# Patient Record
Sex: Female | Born: 1940 | Race: Black or African American | Hispanic: No | Smoking: Never smoker
Health system: Southern US, Community
[De-identification: ages and names within clinical notes are randomized; demographics above are authoritative.]

## PROBLEM LIST (undated history)

## (undated) DIAGNOSIS — K279 Peptic ulcer, site unspecified, unspecified as acute or chronic, without hemorrhage or perforation: Secondary | ICD-10-CM

## (undated) DIAGNOSIS — K219 Gastro-esophageal reflux disease without esophagitis: Secondary | ICD-10-CM

## (undated) DIAGNOSIS — Z5189 Encounter for other specified aftercare: Secondary | ICD-10-CM

## (undated) DIAGNOSIS — D649 Anemia, unspecified: Secondary | ICD-10-CM

## (undated) DIAGNOSIS — M1990 Unspecified osteoarthritis, unspecified site: Secondary | ICD-10-CM

## (undated) DIAGNOSIS — K208 Other esophagitis without bleeding: Secondary | ICD-10-CM

## (undated) HISTORY — DX: Encounter for other specified aftercare: Z51.89

## (undated) HISTORY — DX: Anemia, unspecified: D64.9

## (undated) HISTORY — DX: Unspecified osteoarthritis, unspecified site: M19.90

---

## 2019-11-30 ENCOUNTER — Inpatient Hospital Stay (HOSPITAL_COMMUNITY)
Admission: EM | Admit: 2019-11-30 | Discharge: 2019-12-04 | DRG: 812 | Disposition: A | Discharge status: Home / Self Care | Payer: Medicare HMO | Attending: Internal Medicine | Admitting: Internal Medicine

## 2019-11-30 ENCOUNTER — Other Ambulatory Visit: Payer: Self-pay

## 2019-11-30 DIAGNOSIS — B9681 Helicobacter pylori [H. pylori] as the cause of diseases classified elsewhere: Secondary | ICD-10-CM | POA: Diagnosis present

## 2019-11-30 DIAGNOSIS — K2970 Gastritis, unspecified, without bleeding: Secondary | ICD-10-CM | POA: Diagnosis present

## 2019-11-30 DIAGNOSIS — K642 Third degree hemorrhoids: Secondary | ICD-10-CM

## 2019-11-30 DIAGNOSIS — E876 Hypokalemia: Secondary | ICD-10-CM | POA: Diagnosis not present

## 2019-11-30 DIAGNOSIS — K5900 Constipation, unspecified: Secondary | ICD-10-CM | POA: Diagnosis present

## 2019-11-30 DIAGNOSIS — K219 Gastro-esophageal reflux disease without esophagitis: Secondary | ICD-10-CM | POA: Diagnosis present

## 2019-11-30 DIAGNOSIS — R531 Weakness: Secondary | ICD-10-CM

## 2019-11-30 DIAGNOSIS — D649 Anemia, unspecified: Secondary | ICD-10-CM | POA: Diagnosis not present

## 2019-11-30 DIAGNOSIS — Z20828 Contact with and (suspected) exposure to other viral communicable diseases: Secondary | ICD-10-CM | POA: Diagnosis present

## 2019-11-30 DIAGNOSIS — Z23 Encounter for immunization: Secondary | ICD-10-CM

## 2019-11-30 DIAGNOSIS — E8809 Other disorders of plasma-protein metabolism, not elsewhere classified: Secondary | ICD-10-CM | POA: Diagnosis present

## 2019-11-30 DIAGNOSIS — D509 Iron deficiency anemia, unspecified: Secondary | ICD-10-CM | POA: Diagnosis not present

## 2019-11-30 DIAGNOSIS — E875 Hyperkalemia: Secondary | ICD-10-CM | POA: Diagnosis present

## 2019-11-30 DIAGNOSIS — R5383 Other fatigue: Secondary | ICD-10-CM | POA: Diagnosis not present

## 2019-11-30 DIAGNOSIS — K635 Polyp of colon: Secondary | ICD-10-CM | POA: Diagnosis present

## 2019-11-30 DIAGNOSIS — K2980 Duodenitis without bleeding: Secondary | ICD-10-CM | POA: Diagnosis present

## 2019-11-30 HISTORY — DX: Gastro-esophageal reflux disease without esophagitis: K21.9

## 2019-11-30 LAB — CBC
HCT: 9.3 % — ABNORMAL LOW (ref 36.0–46.0)
Hemoglobin: 2.6 g/dL — CL (ref 12.0–15.0)
MCH: 18.2 pg — ABNORMAL LOW (ref 26.0–34.0)
MCHC: 28 g/dL — ABNORMAL LOW (ref 30.0–36.0)
MCV: 65 fL — ABNORMAL LOW (ref 80.0–100.0)
Platelets: 381 10*3/uL (ref 150–400)
RBC: 1.43 MIL/uL — ABNORMAL LOW (ref 3.87–5.11)
RDW: 19.5 % — ABNORMAL HIGH (ref 11.5–15.5)
WBC: 12.7 10*3/uL — ABNORMAL HIGH (ref 4.0–10.5)
nRBC: 1.4 % — ABNORMAL HIGH (ref 0.0–0.2)

## 2019-11-30 LAB — BASIC METABOLIC PANEL
Anion gap: 11 (ref 5–15)
BUN: 19 mg/dL (ref 8–23)
CO2: 22 mmol/L (ref 22–32)
Calcium: 8 mg/dL — ABNORMAL LOW (ref 8.9–10.3)
Chloride: 103 mmol/L (ref 98–111)
Creatinine, Ser: 0.68 mg/dL (ref 0.44–1.00)
GFR calc Af Amer: >60 mL/min (ref >60)
GFR calc non Af Amer: >60 mL/min (ref >60)
Glucose, Bld: 107 mg/dL — ABNORMAL HIGH (ref 70–99)
Potassium: 2.5 mmol/L — CL (ref 3.5–5.1)
Sodium: 136 mmol/L (ref 135–145)

## 2019-11-30 LAB — COMPREHENSIVE METABOLIC PANEL
ALT: 11 U/L (ref 0–44)
AST: 10 U/L — ABNORMAL LOW (ref 15–41)
Albumin: 3.2 g/dL — ABNORMAL LOW (ref 3.5–5.0)
Alkaline Phosphatase: 60 U/L (ref 38–126)
Anion gap: 13 (ref 5–15)
BUN: 21 mg/dL (ref 8–23)
CO2: 22 mmol/L (ref 22–32)
Calcium: 8.5 mg/dL — ABNORMAL LOW (ref 8.9–10.3)
Chloride: 99 mmol/L (ref 98–111)
Creatinine, Ser: 0.89 mg/dL (ref 0.44–1.00)
GFR calc Af Amer: >60 mL/min (ref >60)
GFR calc non Af Amer: >60 mL/min (ref >60)
Glucose, Bld: 118 mg/dL — ABNORMAL HIGH (ref 70–99)
Potassium: 2.3 mmol/L — CL (ref 3.5–5.1)
Sodium: 134 mmol/L — ABNORMAL LOW (ref 135–145)
Total Bilirubin: 0.4 mg/dL (ref 0.3–1.2)
Total Protein: 6.7 g/dL (ref 6.5–8.1)

## 2019-11-30 LAB — CBC WITH DIFFERENTIAL/PLATELET
Abs Immature Granulocytes: 0.15 10*3/uL — ABNORMAL HIGH (ref 0.00–0.07)
Basophils Absolute: 0 10*3/uL (ref 0.0–0.1)
Basophils Relative: 0 %
Eosinophils Absolute: 0 10*3/uL (ref 0.0–0.5)
Eosinophils Relative: 0 %
HCT: 9.1 % — ABNORMAL LOW (ref 36.0–46.0)
Hemoglobin: 2.5 g/dL — CL (ref 12.0–15.0)
Immature Granulocytes: 1 %
Lymphocytes Relative: 3 %
Lymphs Abs: 0.4 10*3/uL — ABNORMAL LOW (ref 0.7–4.0)
MCH: 17.5 pg — ABNORMAL LOW (ref 26.0–34.0)
MCHC: 27.5 g/dL — ABNORMAL LOW (ref 30.0–36.0)
MCV: 63.6 fL — ABNORMAL LOW (ref 80.0–100.0)
Monocytes Absolute: 0.9 10*3/uL (ref 0.1–1.0)
Monocytes Relative: 6 %
Neutro Abs: 13.7 10*3/uL — ABNORMAL HIGH (ref 1.7–7.7)
Neutrophils Relative %: 90 %
Platelets: 432 10*3/uL — ABNORMAL HIGH (ref 150–400)
RBC: 1.43 MIL/uL — ABNORMAL LOW (ref 3.87–5.11)
RDW: 19.1 % — ABNORMAL HIGH (ref 11.5–15.5)
WBC: 15.2 10*3/uL — ABNORMAL HIGH (ref 4.0–10.5)
nRBC: 1.3 % — ABNORMAL HIGH (ref 0.0–0.2)

## 2019-11-30 LAB — RETICULOCYTES
Immature Retic Fract: 20.7 % — ABNORMAL HIGH (ref 2.3–15.9)
RBC.: 1.49 MIL/uL — ABNORMAL LOW (ref 3.87–5.11)
Retic Count, Absolute: 21.8 10*3/uL (ref 19.0–186.0)
Retic Ct Pct: 1.5 % (ref 0.4–3.1)

## 2019-11-30 LAB — IRON AND TIBC
Iron: 7 ug/dL — ABNORMAL LOW (ref 28–170)
Saturation Ratios: 2 % — ABNORMAL LOW (ref 10.4–31.8)
TIBC: 414 ug/dL (ref 250–450)
UIBC: 407 ug/dL

## 2019-11-30 LAB — POC OCCULT BLOOD, ED: Fecal Occult Bld: NEGATIVE

## 2019-11-30 LAB — PREPARE RBC (CROSSMATCH)

## 2019-11-30 LAB — FOLATE: Folate: 5.4 ng/mL — ABNORMAL LOW (ref >5.9)

## 2019-11-30 LAB — MRSA PCR SCREENING: MRSA by PCR: NEGATIVE

## 2019-11-30 LAB — MAGNESIUM: Magnesium: 2 mg/dL (ref 1.7–2.4)

## 2019-11-30 LAB — TSH: TSH: 0.836 u[IU]/mL (ref 0.350–4.500)

## 2019-11-30 LAB — FERRITIN: Ferritin: 4 ng/mL — ABNORMAL LOW (ref 11–307)

## 2019-11-30 LAB — PHOSPHORUS: Phosphorus: 2.2 mg/dL — ABNORMAL LOW (ref 2.5–4.6)

## 2019-11-30 LAB — VITAMIN B12: Vitamin B-12: 854 pg/mL (ref 180–914)

## 2019-11-30 MED ORDER — POTASSIUM CHLORIDE CRYS ER 20 MEQ PO TBCR
40.0000 meq | EXTENDED_RELEASE_TABLET | ORAL | Status: AC
  Administered 2019-12-01 (×2): 40 meq via ORAL
  Filled 2019-11-30 (×2): qty 2

## 2019-11-30 MED ORDER — CHLORHEXIDINE GLUCONATE CLOTH 2 % EX PADS
6.0000 | MEDICATED_PAD | Freq: Every day | CUTANEOUS | Status: DC
  Administered 2019-11-30 – 2019-12-04 (×5): 6 via TOPICAL

## 2019-11-30 MED ORDER — POTASSIUM CHLORIDE CRYS ER 20 MEQ PO TBCR
40.0000 meq | EXTENDED_RELEASE_TABLET | Freq: Once | ORAL | Status: AC
  Administered 2019-11-30: 40 meq via ORAL
  Filled 2019-11-30: qty 2

## 2019-11-30 MED ORDER — POTASSIUM CHLORIDE 10 MEQ/100ML IV SOLN
10.0000 meq | INTRAVENOUS | Status: AC
  Administered 2019-11-30 (×3): 10 meq via INTRAVENOUS
  Filled 2019-11-30 (×3): qty 100

## 2019-11-30 MED ORDER — FUROSEMIDE 10 MG/ML IJ SOLN
10.0000 mg | INTRAMUSCULAR | Status: DC
  Administered 2019-12-01 (×2): 10 mg via INTRAVENOUS
  Filled 2019-11-30 (×3): qty 2

## 2019-11-30 MED ORDER — PANTOPRAZOLE SODIUM 40 MG PO TBEC
40.0000 mg | DELAYED_RELEASE_TABLET | Freq: Every day | ORAL | Status: DC
  Administered 2019-11-30 – 2019-12-03 (×2): 40 mg via ORAL
  Filled 2019-11-30 (×3): qty 1

## 2019-11-30 MED ORDER — SODIUM CHLORIDE 0.9 % IV SOLN: 10.0000 mL/h | Freq: Once | INTRAVENOUS | Status: DC

## 2019-11-30 MED ORDER — SODIUM CHLORIDE 0.9 % IV BOLUS
500.0000 mL | Freq: Once | INTRAVENOUS | Status: AC
  Administered 2019-11-30: 500 mL via INTRAVENOUS

## 2019-11-30 NOTE — ED Notes (Signed)
Pt provided electronic consent for blood consent.

## 2019-11-30 NOTE — ED Provider Notes (Signed)
Emergency Department Provider Note   I have reviewed the triage vital signs and the nursing notes.   HISTORY  Chief Complaint Weakness   HPI Danielle Adkins is a 78 y.o. female with unknown PMH presents to the emergency department with mild fatigue over the past several weeks.  Patient lives at home with her son who is noticed some increased fatigue over the past several weeks.  He states that today he mistakenly thought she had some rectal bleeding but later realized that the patient just had some mild fecal incontinence.  Patient states she has not been having diarrhea but just had an accident.  There was no bright red blood or black in the bowel movements.  No vomiting.  No fevers or chills.  She denies any chest pain or shortness of breath.  No cough or sore throat.  Patient states that she does not see her primary care doctor regularly and has not taken any new medications.  She denies abdominal pain or back discomfort.  Patient tells me that her son made her come in to get checked out but that she feels completely fine.   No past medical history on file.  Patient Active Problem List   Diagnosis Date Noted  . Symptomatic anemia 11/30/2019   Allergies Patient has no known allergies.  No family history on file.  Social History Social History   Tobacco Use  . Smoking status: Not on file  Substance Use Topics  . Alcohol use: Not on file  . Drug use: Not on file    Review of Systems  Constitutional: No fever/chills. Positive mild fatigue.  Eyes: No visual changes. ENT: No sore throat. Cardiovascular: Denies chest pain. Respiratory: Denies shortness of breath. Gastrointestinal: No abdominal pain.  No nausea, no vomiting.  No diarrhea.  No constipation. Genitourinary: Negative for dysuria. Musculoskeletal: Negative for back pain. Skin: Negative for rash. Neurological: Negative for headaches, focal weakness or numbness.  10-point ROS otherwise negative.   ____________________________________________   PHYSICAL EXAM:  VITAL SIGNS: ED Triage Vitals  Enc Vitals Group     BP 11/30/19 1627 130/74     Pulse Rate 11/30/19 1627 87     Resp 11/30/19 1627 (!) 22     Temp 11/30/19 1627 98.2 F (36.8 C)     Temp Source 11/30/19 1627 Oral     SpO2 11/30/19 1623 96 %   Constitutional: Alert and oriented. Well appearing and in no acute distress. Eyes: Conjunctivae are normal.  Head: Atraumatic. Nose: No congestion/rhinnorhea. Mouth/Throat: Mucous membranes are moist.  Cardiovascular: Normal rate, regular rhythm. Good peripheral circulation. Grossly normal heart sounds.   Respiratory: Normal respiratory effort.  No retractions. Lungs CTAB. Gastrointestinal: Soft and nontender. No distention. Rectal exam performed with patient verbal consent and nurse chaperone. Brown stool without gross blood or melena. Hemoccult negative.  Musculoskeletal: No lower extremity tenderness nor edema. No gross deformities of extremities. Neurologic:  Normal speech and language. No gross focal neurologic deficits are appreciated.  Skin:  Skin is warm, dry and intact. No rash noted.  ____________________________________________   LABS (all labs ordered are listed, but only abnormal results are displayed)  Labs Reviewed  CBC WITH DIFFERENTIAL/PLATELET - Abnormal; Notable for the following components:      Result Value   WBC 15.2 (*)    RBC 1.43 (*)    Hemoglobin 2.5 (*)    HCT 9.1 (*)    MCV 63.6 (*)    MCH 17.5 (*)  MCHC 27.5 (*)    RDW 19.1 (*)    Platelets 432 (*)    nRBC 1.3 (*)    Neutro Abs 13.7 (*)    Lymphs Abs 0.4 (*)    Abs Immature Granulocytes 0.15 (*)    All other components within normal limits  COMPREHENSIVE METABOLIC PANEL - Abnormal; Notable for the following components:   Sodium 134 (*)    Potassium 2.3 (*)    Glucose, Bld 118 (*)    Calcium 8.5 (*)    Albumin 3.2 (*)    AST 10 (*)    All other components within normal limits   IRON AND TIBC - Abnormal; Notable for the following components:   Iron 7 (*)    Saturation Ratios 2 (*)    All other components within normal limits  FERRITIN - Abnormal; Notable for the following components:   Ferritin 4 (*)    All other components within normal limits  RETICULOCYTES - Abnormal; Notable for the following components:   RBC. 1.49 (*)    Immature Retic Fract 20.7 (*)    All other components within normal limits  CBC - Abnormal; Notable for the following components:   WBC 12.7 (*)    RBC 1.43 (*)    Hemoglobin 2.6 (*)    HCT 9.3 (*)    MCV 65.0 (*)    MCH 18.2 (*)    MCHC 28.0 (*)    RDW 19.5 (*)    nRBC 1.4 (*)    All other components within normal limits  MRSA PCR SCREENING  URINE CULTURE  SARS CORONAVIRUS 2 (TAT 6-24 HRS)  MAGNESIUM  VITAMIN B12  TSH  CBC WITH DIFFERENTIAL/PLATELET  URINALYSIS, ROUTINE W REFLEX MICROSCOPIC  FOLATE  BASIC METABOLIC PANEL  BASIC METABOLIC PANEL  PHOSPHORUS  HEMOGLOBIN  HEMOGLOBIN  HEMATOCRIT  HEMATOCRIT  BASIC METABOLIC PANEL  CBC  FOLATE RBC  POC OCCULT BLOOD, ED  TYPE AND SCREEN  PREPARE RBC (CROSSMATCH)  ABO/RH   ____________________________________________  EKG   EKG Interpretation  Date/Time:  Tuesday November 30 2019 16:49:17 EST Ventricular Rate:  77 PR Interval:    QRS Duration: 157 QT Interval:  418 QTC Calculation: 474 R Axis:   -3 Text Interpretation: Sinus rhythm Atrial premature complexes Right bundle branch block No STEMI. Similar to prior. Confirmed by Nanda Quinton [phone]) on 11/30/2019 4:51:24 PM       ____________________________________________  RADIOLOGY  None  ____________________________________________   PROCEDURES  Procedure(s) performed:   Procedures  CRITICAL CARE Performed by: Margette Fast Total critical care time: 35 minutes Critical care time was exclusive of separately billable procedures and treating other patients. Critical care was necessary to treat or  prevent imminent or life-threatening deterioration. Critical care was time spent personally by me on the following activities: development of treatment plan with patient and/or surrogate as well as nursing, discussions with consultants, evaluation of patient's response to treatment, examination of patient, obtaining history from patient or surrogate, ordering and performing treatments and interventions, ordering and review of laboratory studies, ordering and review of radiographic studies, pulse oximetry and re-evaluation of patient's condition.  Nanda Quinton, MD Emergency Medicine  ____________________________________________   INITIAL IMPRESSION / ASSESSMENT AND PLAN / ED COURSE  Pertinent labs & imaging results that were available during my care of the patient were reviewed by me and considered in my medical decision making (see chart for details).   Patient presents to the emergency department with mild generalized weakness and somewhat  decreased appetite.  Patient's neurologic exam, abdominal exam, cardiovascular exam are completely within normal limits.  Patient does agree to some screening blood work given lack of known medical history along with UA, IV fluids, EKG. I do not see indication for imaging at this time.  Patient's vital signs are unremarkable.  The initial report of possible rectal bleeding is apparently a mistake from the son who can now confirm that there was no rectal bleeding and only some mild fecal incontinence.  Patient with significant anemia with hemoglobin of 2.5.  This lab sample was a direct stick and not pulled from an IV.  Potassium also at 10.3.  I have added on a magnesium I discussed the results with the patient.  She is Hemoccult negative.  I have sent an anemia panel.  The son does pull me out of the room and states that his mom does drink regularly and is wondering if that could be contributing.  Patient consented to receive 3 units PRBC and reassess.    Discussed patient's case with [facility] to request admission. Patient and family (if present) updated with plan. Care transferred to [facility] service.  I reviewed all nursing notes, vitals, pertinent old records, EKGs, labs, imaging (as available).  ____________________________________________  FINAL CLINICAL IMPRESSION(S) / ED DIAGNOSES  Final diagnoses:  Symptomatic anemia  Generalized weakness  Hypokalemia     MEDICATIONS GIVEN DURING THIS VISIT:  Medications  0.9 %  sodium chloride infusion (has no administration in time range)  pantoprazole (PROTONIX) EC tablet 40 mg (40 mg Oral Given 11/30/19 2224)  furosemide (LASIX) injection 10 mg (has no administration in time range)  potassium chloride SA (KLOR-CON) CR tablet 40 mEq (has no administration in time range)  Chlorhexidine Gluconate Cloth 2 % PADS 6 each (6 each Topical Given 11/30/19 2117)  sodium chloride 0.9 % bolus 500 mL (0 mLs Intravenous Stopped 11/30/19 1839)  potassium chloride 10 mEq in 100 mL IVPB (0 mEq Intravenous Stopped 11/30/19 2311)  potassium chloride SA (KLOR-CON) CR tablet 40 mEq (40 mEq Oral Given 11/30/19 2032)    Note:  This document was prepared using Dragon voice recognition software and may include unintentional dictation errors.  Nanda Quinton, MD, [facility] Emergency Medicine    Shanel Prazak, Wonda Olds, MD 11/30/19 [phone]

## 2019-11-30 NOTE — H&P (Signed)
History and Physical  Nahla Bargman [id number] DOB: [birth year] DOA: 11/30/2019  Referring physician: ER provider PCP: Patient, No Pcp Per  Outpatient Specialists:    Patient coming from: Home  Chief Complaint: Weakness  HPI:  Patient is an 78 year old African-American female who has not been to the doctors in the long time.  Patient has no documented past medical history.  Patient is a very poor historian.  Apparently, patient's son was concerned as patient was becoming progressively weak.  Patient's son convinced the patient to come to the hospital.  On presentation to the hospital, patient was found to have hemoglobin of 2.5, potassium of 2.3, magnesium of 2 with MCV of 63.6.  Patient was on ibuprofen 200 Mg p.o. every 6 hourly as needed prior to presentation.  Patient denied hematochezia..  Occult blood was negative.  No headache, no neck pain, no chest pain, no shortness of breath, no fever or chills, no GI symptoms and no urinary symptoms.  Patient be admitted for further assessment and management.  ED Course: On presentation to the hospital, temperature was 98.4, blood pressure of 133/63, heart rate of 71, respiratory rate of 19 and O2 sat of 100%.  Pertinent labs revealed WBC of 12.7, hemoglobin of 2.5, potassium of 2.3.  Magnesium was 2.  First fecal occult blood came back negative.  Emergency room provider has crossmatched 3 units of packed red blood cells for transfusion.  Electrolytes are being repleted.    Pertinent labs: As documented above.  EKG: Independently reviewed.  Atrial premature complexes and right bundle branch block.  Review of Systems:  Negative for fever, visual changes, sore throat, rash, new muscle aches, chest pain, SOB, dysuria, bleeding, n/v/abdominal pain.  No past medical history on file.   has no history on file for tobacco, alcohol, and drug.  No Known Allergies  No family history on file.   Prior to Admission medications   Medication Sig Start  Date End Date Taking? Authorizing Provider  ibuprofen (ADVIL) 200 MG tablet Take 200 mg by mouth every 6 (six) hours as needed for fever or moderate pain.   Yes [provider]  omeprazole (PRILOSEC OTC) 20 MG tablet Take 20 mg by mouth daily.   Yes [provider]    Physical Exam: Vitals:   11/30/19 1627 11/30/19 1730 11/30/19 1800 11/30/19 1830  BP: 130/74 (!) 115/51 (!) 113/43 (!) 122/59  Pulse: 87   85  Resp: (!) 22 12 14 15   Temp: 98.2 F (36.8 C)     TempSrc: Oral     SpO2: 96%   95%    Constitutional:  . Appears calm and comfortable.  Thin. Eyes:  . Severe pallor. No jaundice.  ENMT:  . external ears, nose appear normal Neck:  . Neck is supple. No JVD Respiratory:  . CTA bilaterally, no w/r/r.  . Respiratory effort normal. No retractions or accessory muscle use Cardiovascular:  . S1S2 . No LE extremity edema   Abdomen:  . Abdomen is obese, soft and non tender. Organs are difficult to assess. Neurologic:  . Awake and alert. . Moves all limbs.  Wt Readings from Last 3 Encounters:  No data found for Wt    I have personally reviewed following labs and imaging studies  Labs on Admission:  CBC: Recent Labs  Lab 11/30/19 1740  WBC 15.2*  NEUTROABS 13.7*  HGB 2.5*  HCT 9.1*  MCV 63.6*  PLT 123456*   Basic Metabolic Panel: Recent Labs  Lab 11/30/19  1740  NA 134*  K 2.3*  CL 99  CO2 22  GLUCOSE 118*  BUN 21  CREATININE 0.89  CALCIUM 8.5*  MG 2.0   Liver Function Tests: Recent Labs  Lab 11/30/19 1740  AST 10*  ALT 11  ALKPHOS 60  BILITOT 0.4  PROT 6.7  ALBUMIN 3.2*   No results for input(s): LIPASE, AMYLASE in the last 168 hours. No results for input(s): AMMONIA in the last 168 hours. Coagulation Profile: No results for input(s): INR, PROTIME in the last 168 hours. Cardiac Enzymes: No results for input(s): CKTOTAL, CKMB, CKMBINDEX, TROPONINI in the last 168 hours. BNP (last 3 results) No results for input(s): PROBNP  in the last 8760 hours. HbA1C: No results for input(s): HGBA1C in the last 72 hours. CBG: No results for input(s): GLUCAP in the last 168 hours. Lipid Profile: No results for input(s): CHOL, HDL, LDLCALC, TRIG, CHOLHDL, LDLDIRECT in the last 72 hours. Thyroid Function Tests: No results for input(s): TSH, T4TOTAL, FREET4, T3FREE, THYROIDAB in the last 72 hours. Anemia Panel: No results for input(s): VITAMINB12, FOLATE, FERRITIN, TIBC, IRON, RETICCTPCT in the last 72 hours. Urine analysis: No results found for: COLORURINE, APPEARANCEUR, LABSPEC, PHURINE, GLUCOSEU, HGBUR, BILIRUBINUR, KETONESUR, PROTEINUR, UROBILINOGEN, NITRITE, LEUKOCYTESUR Sepsis Labs: @LABRCNTIP (procalcitonin:4,lacticidven:4) )No results found for this or any previous visit (from the past 240 hour(s)).    Radiological Exams on Admission: No results found.  EKG: Independently reviewed.   Active Problems:   Symptomatic anemia   Assessment/Plan Severe symptomatic anemia: -Place patient in observation. -Hemoglobin was 2.5 g/dL -Iron studies. -Fecal occult blood x3. -Suspect that the patient has GI blood loss -Check RBC folate and vitamin B12 -Consider consulting GI team in the morning.  Hypokalemia: -Potassium was 2.3. -Etiology unclear. -Continue to replete. -Continue to assess.  DVT prophylaxis: SCD. Code Status: Full code Family Communication:  Disposition Plan: Home eventually Consults called: Please consider consulting GI in the morning Admission status: Observation  Time spent: 60 minutes minutes  Dana Allan, MD  [facility] Pager #: [phone] 7PM-7AM contact night coverage as above  11/30/2019, 7:33 PM

## 2019-11-30 NOTE — ED Triage Notes (Signed)
Per EMS, Pt is coming from home. Pts son called because he believed pt had rectal bleeding. Pt did not have any rectal bleeding, but had a BM. Pt denies N/V/D. Pts only complaint is slight weakness for the past week.

## 2019-11-30 NOTE — ED Notes (Addendum)
Potassium 2.3, MD notified verbally.

## 2019-12-01 ENCOUNTER — Encounter (HOSPITAL_COMMUNITY): Payer: Self-pay

## 2019-12-01 ENCOUNTER — Other Ambulatory Visit: Payer: Self-pay

## 2019-12-01 DIAGNOSIS — B9681 Helicobacter pylori [H. pylori] as the cause of diseases classified elsewhere: Secondary | ICD-10-CM | POA: Diagnosis present

## 2019-12-01 DIAGNOSIS — E876 Hypokalemia: Secondary | ICD-10-CM | POA: Diagnosis present

## 2019-12-01 DIAGNOSIS — D649 Anemia, unspecified: Secondary | ICD-10-CM | POA: Diagnosis not present

## 2019-12-01 DIAGNOSIS — K642 Third degree hemorrhoids: Secondary | ICD-10-CM | POA: Diagnosis present

## 2019-12-01 DIAGNOSIS — K219 Gastro-esophageal reflux disease without esophagitis: Secondary | ICD-10-CM | POA: Diagnosis present

## 2019-12-01 DIAGNOSIS — K269 Duodenal ulcer, unspecified as acute or chronic, without hemorrhage or perforation: Secondary | ICD-10-CM | POA: Diagnosis not present

## 2019-12-01 DIAGNOSIS — Z23 Encounter for immunization: Secondary | ICD-10-CM | POA: Diagnosis present

## 2019-12-01 DIAGNOSIS — K2980 Duodenitis without bleeding: Secondary | ICD-10-CM | POA: Diagnosis present

## 2019-12-01 DIAGNOSIS — D500 Iron deficiency anemia secondary to blood loss (chronic): Secondary | ICD-10-CM | POA: Diagnosis not present

## 2019-12-01 DIAGNOSIS — K2210 Ulcer of esophagus without bleeding: Secondary | ICD-10-CM | POA: Diagnosis not present

## 2019-12-01 DIAGNOSIS — Z20828 Contact with and (suspected) exposure to other viral communicable diseases: Secondary | ICD-10-CM | POA: Diagnosis present

## 2019-12-01 DIAGNOSIS — K2970 Gastritis, unspecified, without bleeding: Secondary | ICD-10-CM | POA: Diagnosis present

## 2019-12-01 DIAGNOSIS — D509 Iron deficiency anemia, unspecified: Secondary | ICD-10-CM

## 2019-12-01 DIAGNOSIS — E8809 Other disorders of plasma-protein metabolism, not elsewhere classified: Secondary | ICD-10-CM | POA: Diagnosis present

## 2019-12-01 DIAGNOSIS — E875 Hyperkalemia: Secondary | ICD-10-CM | POA: Diagnosis present

## 2019-12-01 DIAGNOSIS — K5900 Constipation, unspecified: Secondary | ICD-10-CM | POA: Diagnosis present

## 2019-12-01 DIAGNOSIS — K635 Polyp of colon: Secondary | ICD-10-CM | POA: Diagnosis present

## 2019-12-01 DIAGNOSIS — R5383 Other fatigue: Secondary | ICD-10-CM | POA: Diagnosis present

## 2019-12-01 LAB — HEMATOCRIT: HCT: 21.4 % — ABNORMAL LOW (ref 36.0–46.0)

## 2019-12-01 LAB — BASIC METABOLIC PANEL
Anion gap: 11 (ref 5–15)
Anion gap: 10 (ref 5–15)
BUN: 17 mg/dL (ref 8–23)
BUN: 15 mg/dL (ref 8–23)
CO2: 21 mmol/L — ABNORMAL LOW (ref 22–32)
CO2: 21 mmol/L — ABNORMAL LOW (ref 22–32)
Calcium: 8.1 mg/dL — ABNORMAL LOW (ref 8.9–10.3)
Calcium: 8.4 mg/dL — ABNORMAL LOW (ref 8.9–10.3)
Chloride: 104 mmol/L (ref 98–111)
Chloride: 106 mmol/L (ref 98–111)
Creatinine, Ser: 0.65 mg/dL (ref 0.44–1.00)
Creatinine, Ser: 0.54 mg/dL (ref 0.44–1.00)
GFR calc Af Amer: >60 mL/min (ref >60)
GFR calc Af Amer: >60 mL/min (ref >60)
GFR calc non Af Amer: >60 mL/min (ref >60)
GFR calc non Af Amer: >60 mL/min (ref >60)
Glucose, Bld: 97 mg/dL (ref 70–99)
Glucose, Bld: 95 mg/dL (ref 70–99)
Potassium: 3.2 mmol/L — ABNORMAL LOW (ref 3.5–5.1)
Potassium: 4.1 mmol/L (ref 3.5–5.1)
Sodium: 136 mmol/L (ref 135–145)
Sodium: 137 mmol/L (ref 135–145)

## 2019-12-01 LAB — URINALYSIS, ROUTINE W REFLEX MICROSCOPIC
Bilirubin Urine: NEGATIVE
Glucose, UA: NEGATIVE mg/dL
Ketones, ur: NEGATIVE mg/dL
Nitrite: NEGATIVE
Protein, ur: NEGATIVE mg/dL
Specific Gravity, Urine: 1.008 (ref 1.005–1.030)
pH: 5 (ref 5.0–8.0)

## 2019-12-01 LAB — CBC
HCT: 21.1 % — ABNORMAL LOW (ref 36.0–46.0)
Hemoglobin: 6.8 g/dL — CL (ref 12.0–15.0)
MCH: 24.7 pg — ABNORMAL LOW (ref 26.0–34.0)
MCHC: 32.2 g/dL (ref 30.0–36.0)
MCV: 76.7 fL — ABNORMAL LOW (ref 80.0–100.0)
Platelets: 338 10*3/uL (ref 150–400)
RBC: 2.75 MIL/uL — ABNORMAL LOW (ref 3.87–5.11)
RDW: 22.5 % — ABNORMAL HIGH (ref 11.5–15.5)
WBC: 13.3 10*3/uL — ABNORMAL HIGH (ref 4.0–10.5)
nRBC: 2.6 % — ABNORMAL HIGH (ref 0.0–0.2)

## 2019-12-01 LAB — HEMOGLOBIN: Hemoglobin: 6.9 g/dL — CL (ref 12.0–15.0)

## 2019-12-01 LAB — SARS CORONAVIRUS 2 (TAT 6-24 HRS): SARS Coronavirus 2: NEGATIVE

## 2019-12-01 LAB — ABO/RH: ABO/RH(D): O POS

## 2019-12-01 LAB — PREPARE RBC (CROSSMATCH)

## 2019-12-01 MED ORDER — PEG-KCL-NACL-NASULF-NA ASC-C 100 G PO SOLR
0.5000 | Freq: Once | ORAL | Status: AC
  Administered 2019-12-01: 100 g via ORAL
  Filled 2019-12-01: qty 1

## 2019-12-01 MED ORDER — POTASSIUM CHLORIDE 10 MEQ/100ML IV SOLN
10.0000 meq | INTRAVENOUS | Status: AC
  Administered 2019-12-01 (×4): 10 meq via INTRAVENOUS
  Filled 2019-12-01 (×4): qty 100

## 2019-12-01 MED ORDER — SODIUM CHLORIDE 0.9% IV SOLUTION
Freq: Once | INTRAVENOUS | Status: AC
  Administered 2019-12-01: 12:00:00 via INTRAVENOUS

## 2019-12-01 MED ORDER — PEG-KCL-NACL-NASULF-NA ASC-C 100 G PO SOLR: 1.0000 | Freq: Once | ORAL | Status: DC

## 2019-12-01 MED ORDER — SODIUM CHLORIDE 0.9 % IV SOLN
510.0000 mg | Freq: Once | INTRAVENOUS | Status: AC
  Administered 2019-12-01: 510 mg via INTRAVENOUS
  Filled 2019-12-01: qty 17

## 2019-12-01 MED ORDER — FOLIC ACID 1 MG PO TABS
1.0000 mg | ORAL_TABLET | Freq: Every day | ORAL | Status: DC
  Administered 2019-12-03 – 2019-12-04 (×2): 1 mg via ORAL
  Filled 2019-12-01 (×3): qty 1

## 2019-12-01 MED ORDER — PEG-KCL-NACL-NASULF-NA ASC-C 100 G PO SOLR
0.5000 | Freq: Once | ORAL | Status: AC
  Administered 2019-12-02: 100 g via ORAL

## 2019-12-01 MED ORDER — BISACODYL 5 MG PO TBEC
10.0000 mg | DELAYED_RELEASE_TABLET | Freq: Once | ORAL | Status: AC
  Administered 2019-12-01: 10 mg via ORAL
  Filled 2019-12-01: qty 2

## 2019-12-01 NOTE — H&P (View-Only) (Signed)
Referring Provider:  [facility]         Primary Care Physician:  Patient, No Pcp Per Primary Gastroenterologist:  Unassigned           Reason for Consultation:   anemia                ASSESSMENT /  PLAN   1. 78 yo female with profound iron deficiency anemia, heme negative stools, no focal GI symptoms.  -For further evaluation will scheduled for EGD and colonoscopy. The risks and benefits of EGD and colonoscopy with possible polypectomy were discussed and the patient agrees to proceed.    2. Hyperkalemia, resolved.   3. Etoh use per her Son. Liver tests okay except mild hypoalbuminemia which may be nutritional.    HPI:    Danielle Adkins is a 78 y.o. female brought to ED by EMA yesterday. Son thought patient was having rectal bleeding but turned out to be just fecal incontinence. Patient profoundly anemic with hgb of 2.5, ferritin of 4.  Occasionally has seen some dark but not black stools and this hasn't happened recently. She denies any overt GI bleeding. No vaginal bleeding nor hematuria. No abdominal pain, no nausea / vomiting. Reports good appetite. No history of any GI problems other than occasional constipation and occasional GERD treated with Omeprazole or tums. She takes Ibuprofen as needed. May take several doses a day but very few days out of the month will she do this.    Past Medical History:  Diagnosis Date  . GERD (gastroesophageal reflux disease)     History reviewed. No pertinent surgical history.  Prior to Admission medications   Medication Sig Start Date End Date Taking? Authorizing Provider  ibuprofen (ADVIL) 200 MG tablet Take 200 mg by mouth every 6 (six) hours as needed for fever or moderate pain.   Yes [provider]  omeprazole (PRILOSEC OTC) 20 MG tablet Take 20 mg by mouth daily.   Yes [provider]    Current Facility-Administered Medications  Medication Dose Route Frequency Provider Last Rate Last Dose  . 0.9 %  sodium  chloride infusion (Manually program via Guardrails IV Fluids)   Intravenous Once Dahal, Binaya, MD      . 0.9 %  sodium chloride infusion  10 mL/hr Intravenous Once Long, Wonda Olds, MD      . Chlorhexidine Gluconate Cloth 2 % PADS 6 each  6 each Topical Daily Dana Allan I, MD   6 each at [id number]  . folic acid (FOLVITE) tablet 1 mg  1 mg Oral Daily Dana Allan I, MD      . furosemide (LASIX) injection 10 mg  10 mg Intravenous UD Dana Allan I, MD   10 mg at 12/01/19 0458  . pantoprazole (PROTONIX) EC tablet 40 mg  40 mg Oral Daily Dana Allan I, MD   40 mg at 11/30/19 2224    Allergies as of 11/30/2019  . (No Known Allergies)    History reviewed. No pertinent family history.  Social History   Socioeconomic History  . Marital status: Widowed    Spouse name: Not on file  . Number of children: Not on file  . Years of education: Not on file  . Highest education level: Not on file  Occupational History  . Not on file  Social Needs  . Financial resource strain: Not on file  . Food insecurity    Worry: Not on file    Inability: Not on  file  . Transportation needs    Medical: Not on file    Non-medical: Not on file  Tobacco Use  . Smoking status: Never Smoker  . Smokeless tobacco: Never Used  Substance and Sexual Activity  . Alcohol use: Never    Frequency: Never  . Drug use: Never  . Sexual activity: Not on file  Lifestyle  . Physical activity    Days per week: Not on file    Minutes per session: Not on file  . Stress: Not on file  Relationships  . Social Herbalist on phone: Not on file    Gets together: Not on file    Attends religious service: Not on file    Active member of club or organization: Not on file    Attends meetings of clubs or organizations: Not on file    Relationship status: Not on file  . Intimate partner violence    Fear of current or ex partner: Not on file    Emotionally abused: Not on file    Physically  abused: Not on file    Forced sexual activity: Not on file  Other Topics Concern  . Not on file  Social History Narrative  . Not on file    Review of Systems: All systems reviewed and negative except where noted in HPI.  Physical Exam: Vital signs in last 24 hours: Temp:  [97.9 F (36.6 C)-98.5 F (36.9 C)] 97.9 F (36.6 C) (12/02 0800) Pulse Rate:  [63-116] 69 (12/02 1000) Resp:  [12-26] 15 (12/02 0900) BP: (103-134)/(39-74) 130/60 (12/02 1000) SpO2:  [64 %-100 %] 100 % (12/02 1000) Weight:  [55.9 kg] 55.9 kg (12/01 2100) Last BM Date: 11/29/19 General:   Alert, well-developed,  female in NAD Psych:  Pleasant, cooperative. Normal mood and affect. Eyes:  Pupils equal, sclera clear, no icterus.   Conjunctiva pink. Ears:  Normal auditory acuity. Nose:  No deformity, discharge,  or lesions. Neck:  Supple; no masses Lungs:  Clear throughout to auscultation.   No wheezes, crackles, or rhonchi.  Heart:  Regular rate and rhythm; , no lower extremity edema Abdomen:  Soft, non-distended, nontender, BS active, LUQ with some fullness ( possibly palpating her left colon)   Rectal:  Deferred  Msk:  Symmetrical without gross deformities. . Neurologic:  Alert and  oriented x4;  grossly normal neurologically. Skin:  Intact without significant lesions or rashes.   Intake/Output from previous day: 12/01 0701 - 12/02 0700 In: 1378.2 [Blood:777.3; IV Piggyback:600.8] Out: 2725 [Urine:2725] Intake/Output this shift: No intake/output data recorded.  Lab Results: Recent Labs    11/30/19 1740 11/30/19 2127 12/01/19 0552 12/01/19 0850  WBC 15.2* 12.7* 13.3*  --   HGB 2.5* 2.6* 6.8*  DUPLICATE REQUEST 6.9*  HCT 9.1* 9.3* 123456*  DUPLICATE REQUEST [id number]*  PLT 432* 381 338  --    BMET Recent Labs    11/30/19 2207 12/01/19 0120 12/01/19 0552  NA 136 136 137  K 2.5* 3.2* 4.1  CL 103 104 106  CO2 22 21* 21*  GLUCOSE 107* 97 95  BUN 19 17 15   CREATININE 0.68 0.65 0.54  CALCIUM  8.0* 8.1* 8.4*   LFT Recent Labs    11/30/19 1740  PROT 6.7  ALBUMIN 3.2*  AST 10*  ALT 11  ALKPHOS 60  BILITOT 0.4   PT/INR No results for input(s): LABPROT, INR in the last 72 hours. Hepatitis Panel No results for input(s): HEPBSAG, [place], [place], HEPBIGM in  the last 72 hours.   . CBC Latest Ref Rng & Units 12/01/2019 12/01/2019 12/01/2019  WBC 4.0 - 10.5 K/uL - 13.3(H) -  Hemoglobin 12.0 - 15.0 g/dL 6.9(LL) 6.8(LL) DUPLICATE REQUEST  Hematocrit 36.0 - 46.0 % 21.4(L) 21.1(L) DUPLICATE REQUEST  Platelets 150 - 400 K/uL - 338 -    . CMP Latest Ref Rng & Units 12/01/2019 12/01/2019 11/30/2019  Glucose 70 - 99 mg/dL 95 97 107(H)  BUN 8 - 23 mg/dL 15 17 19   Creatinine 0.44 - 1.00 mg/dL 0.54 0.65 0.68  Sodium 135 - 145 mmol/L 137 136 136  Potassium 3.5 - 5.1 mmol/L 4.1 3.2(L) 2.5(LL)  Chloride 98 - 111 mmol/L 106 104 103  CO2 22 - 32 mmol/L 21(L) 21(L) 22  Calcium 8.9 - 10.3 mg/dL 8.4(L) 8.1(L) 8.0(L)  Total Protein 6.5 - 8.1 g/dL - - -  Total Bilirubin 0.3 - 1.2 mg/dL - - -  Alkaline Phos 38 - 126 U/L - - -  AST 15 - 41 U/L - - -  ALT 0 - 44 U/L - - -   Studies/Results: No results found.  Active Problems:   Symptomatic anemia    Tye Savoy, NP-C @  12/01/2019, 11:22 AM

## 2019-12-01 NOTE — Progress Notes (Signed)
MD gave verbal to not check H&H after unit of blood. Hgb 6.9 before 3rd unit given. H&H scheduled for in the morning

## 2019-12-01 NOTE — Consult Note (Signed)
Referring Provider:  [facility]         Primary Care Physician:  Patient, No Pcp Per Primary Gastroenterologist:  Unassigned           Reason for Consultation:   anemia                ASSESSMENT /  PLAN   1. 78 yo female with profound iron deficiency anemia, heme negative stools, no focal GI symptoms.  -For further evaluation will scheduled for EGD and colonoscopy. The risks and benefits of EGD and colonoscopy with possible polypectomy were discussed and the patient agrees to proceed.    2. Hyperkalemia, resolved.   3. Etoh use per her Son. Liver tests okay except mild hypoalbuminemia which may be nutritional.    HPI:    Danielle Adkins is a 78 y.o. female brought to ED by EMA yesterday. Son thought patient was having rectal bleeding but turned out to be just fecal incontinence. Patient profoundly anemic with hgb of 2.5, ferritin of 4.  Occasionally has seen some dark but not black stools and this hasn't happened recently. She denies any overt GI bleeding. No vaginal bleeding nor hematuria. No abdominal pain, no nausea / vomiting. Reports good appetite. No history of any GI problems other than occasional constipation and occasional GERD treated with Omeprazole or tums. She takes Ibuprofen as needed. May take several doses a day but very few days out of the month will she do this.    Past Medical History:  Diagnosis Date  . GERD (gastroesophageal reflux disease)     History reviewed. No pertinent surgical history.  Prior to Admission medications   Medication Sig Start Date End Date Taking? Authorizing Provider  ibuprofen (ADVIL) 200 MG tablet Take 200 mg by mouth every 6 (six) hours as needed for fever or moderate pain.   Yes [provider]  omeprazole (PRILOSEC OTC) 20 MG tablet Take 20 mg by mouth daily.   Yes [provider]    Current Facility-Administered Medications  Medication Dose Route Frequency Provider Last Rate Last Dose  . 0.9 %  sodium  chloride infusion (Manually program via Guardrails IV Fluids)   Intravenous Once Dahal, Binaya, MD      . 0.9 %  sodium chloride infusion  10 mL/hr Intravenous Once Long, Wonda Olds, MD      . Chlorhexidine Gluconate Cloth 2 % PADS 6 each  6 each Topical Daily Dana Allan I, MD   6 each at [id number]  . folic acid (FOLVITE) tablet 1 mg  1 mg Oral Daily Dana Allan I, MD      . furosemide (LASIX) injection 10 mg  10 mg Intravenous UD Dana Allan I, MD   10 mg at 12/01/19 0458  . pantoprazole (PROTONIX) EC tablet 40 mg  40 mg Oral Daily Dana Allan I, MD   40 mg at 11/30/19 2224    Allergies as of 11/30/2019  . (No Known Allergies)    History reviewed. No pertinent family history.  Social History   Socioeconomic History  . Marital status: Widowed    Spouse name: Not on file  . Number of children: Not on file  . Years of education: Not on file  . Highest education level: Not on file  Occupational History  . Not on file  Social Needs  . Financial resource strain: Not on file  . Food insecurity    Worry: Not on file    Inability: Not on  file  . Transportation needs    Medical: Not on file    Non-medical: Not on file  Tobacco Use  . Smoking status: Never Smoker  . Smokeless tobacco: Never Used  Substance and Sexual Activity  . Alcohol use: Never    Frequency: Never  . Drug use: Never  . Sexual activity: Not on file  Lifestyle  . Physical activity    Days per week: Not on file    Minutes per session: Not on file  . Stress: Not on file  Relationships  . Social Herbalist on phone: Not on file    Gets together: Not on file    Attends religious service: Not on file    Active member of club or organization: Not on file    Attends meetings of clubs or organizations: Not on file    Relationship status: Not on file  . Intimate partner violence    Fear of current or ex partner: Not on file    Emotionally abused: Not on file    Physically  abused: Not on file    Forced sexual activity: Not on file  Other Topics Concern  . Not on file  Social History Narrative  . Not on file    Review of Systems: All systems reviewed and negative except where noted in HPI.  Physical Exam: Vital signs in last 24 hours: Temp:  [97.9 F (36.6 C)-98.5 F (36.9 C)] 97.9 F (36.6 C) (12/02 0800) Pulse Rate:  [63-116] 69 (12/02 1000) Resp:  [12-26] 15 (12/02 0900) BP: (103-134)/(39-74) 130/60 (12/02 1000) SpO2:  [64 %-100 %] 100 % (12/02 1000) Weight:  [55.9 kg] 55.9 kg (12/01 2100) Last BM Date: 11/29/19 General:   Alert, well-developed,  female in NAD Psych:  Pleasant, cooperative. Normal mood and affect. Eyes:  Pupils equal, sclera clear, no icterus.   Conjunctiva pink. Ears:  Normal auditory acuity. Nose:  No deformity, discharge,  or lesions. Neck:  Supple; no masses Lungs:  Clear throughout to auscultation.   No wheezes, crackles, or rhonchi.  Heart:  Regular rate and rhythm; , no lower extremity edema Abdomen:  Soft, non-distended, nontender, BS active, LUQ with some fullness ( possibly palpating her left colon)   Rectal:  Deferred  Msk:  Symmetrical without gross deformities. . Neurologic:  Alert and  oriented x4;  grossly normal neurologically. Skin:  Intact without significant lesions or rashes.   Intake/Output from previous day: 12/01 0701 - 12/02 0700 In: 1378.2 [Blood:777.3; IV Piggyback:600.8] Out: 2725 [Urine:2725] Intake/Output this shift: No intake/output data recorded.  Lab Results: Recent Labs    11/30/19 1740 11/30/19 2127 12/01/19 0552 12/01/19 0850  WBC 15.2* 12.7* 13.3*  --   HGB 2.5* 2.6* 6.8*  DUPLICATE REQUEST 6.9*  HCT 9.1* 9.3* 123456*  DUPLICATE REQUEST [id number]*  PLT 432* 381 338  --    BMET Recent Labs    11/30/19 2207 12/01/19 0120 12/01/19 0552  NA 136 136 137  K 2.5* 3.2* 4.1  CL 103 104 106  CO2 22 21* 21*  GLUCOSE 107* 97 95  BUN 19 17 15   CREATININE 0.68 0.65 0.54  CALCIUM  8.0* 8.1* 8.4*   LFT Recent Labs    11/30/19 1740  PROT 6.7  ALBUMIN 3.2*  AST 10*  ALT 11  ALKPHOS 60  BILITOT 0.4   PT/INR No results for input(s): LABPROT, INR in the last 72 hours. Hepatitis Panel No results for input(s): HEPBSAG, [place], [place], HEPBIGM in  the last 72 hours.   . CBC Latest Ref Rng & Units 12/01/2019 12/01/2019 12/01/2019  WBC 4.0 - 10.5 K/uL - 13.3(H) -  Hemoglobin 12.0 - 15.0 g/dL 6.9(LL) 6.8(LL) DUPLICATE REQUEST  Hematocrit 36.0 - 46.0 % 21.4(L) 21.1(L) DUPLICATE REQUEST  Platelets 150 - 400 K/uL - 338 -    . CMP Latest Ref Rng & Units 12/01/2019 12/01/2019 11/30/2019  Glucose 70 - 99 mg/dL 95 97 107(H)  BUN 8 - 23 mg/dL 15 17 19   Creatinine 0.44 - 1.00 mg/dL 0.54 0.65 0.68  Sodium 135 - 145 mmol/L 137 136 136  Potassium 3.5 - 5.1 mmol/L 4.1 3.2(L) 2.5(LL)  Chloride 98 - 111 mmol/L 106 104 103  CO2 22 - 32 mmol/L 21(L) 21(L) 22  Calcium 8.9 - 10.3 mg/dL 8.4(L) 8.1(L) 8.0(L)  Total Protein 6.5 - 8.1 g/dL - - -  Total Bilirubin 0.3 - 1.2 mg/dL - - -  Alkaline Phos 38 - 126 U/L - - -  AST 15 - 41 U/L - - -  ALT 0 - 44 U/L - - -   Studies/Results: No results found.  Active Problems:   Symptomatic anemia    Tye Savoy, NP-C @  12/01/2019, 11:22 AM

## 2019-12-01 NOTE — Progress Notes (Signed)
PROGRESS NOTE  Danielle Adkins  DOB: 08/30/1941  PCP: Patient, No Pcp Per [id number]  DOA: 11/30/2019  LOS: 0 days   Chief Complaint  Patient presents with  . Weakness   Brief narrative: Danielle Adkins is a 78 y.o. female with no known past medical history, and who has not seen a physician for several years.   Patient presented to the ED on 11/30/2019 with progressively worsening weakness. In the ED, patient was hemodynamically stable. Work-up showed hemoglobin severely low at 2.5 with MCV in 60s, potassium low at 2.3. Fecal occult blood test negative. 2 units of PRBCs transfused. Patient was admitted to hospital medicine service for further evaluation and management. GI consultation was called this morning.  Subjective: Patient was seen and examined this morning.  Pleasant elderly African-American female.  Lying on bed.  Not in distress.  Feels better than at presentation.  Denies any dark stool or hematochezia or hematemesis.  Assessment/Plan: Acute severe anemia Severe iron deficiency anemia Suspected GI bleeding -Presented with hemoglobin of 2.5.  2 units of PRBCs transfused.  Hemoglobin 6.8 this morning.  Plan for 1 more unit.  Repeat hemoglobin tomorrow. -Suspect slow intermittent GI bleeding over several months.  FOBT negative in the ED. -Appropriate reticulocytosis noted. -Severe iron deficiency with ferritin level of only 4. -Although FOBT is negative, suspect GI bleeding as the primary cause. -GI consultation called.  Noted a plan for EGD and colonoscopy tomorrow.  Hypokalemia -Potassium level severely low at 2.3 on admission.  With replacement, potassium level has improved to 4.1 this morning. -Continue to monitor daily labs.  Mobility: Encourage ambulation Diet: Clear liquid diet for now. N.p.o. after midnight DVT prophylaxis:  SCDs Code Status:   Code Status: Full Code  Family Communication:  None at bedside Expected Discharge:  Anticipate 2 to 3 days of stay  Consultants:  GI  Procedures:  EGD and colonoscopy tomorrow  Antimicrobials: Anti-infectives (From admission, onward)   None      Diet Order            Diet NPO time specified  Diet effective midnight        Diet clear liquid Room service appropriate? Yes; Fluid consistency: Thin  Diet effective now              Infusions:  . sodium chloride      Scheduled Meds: . bisacodyl  10 mg Oral Once  . Chlorhexidine Gluconate Cloth  6 each Topical Daily  . folic acid  1 mg Oral Daily  . furosemide  10 mg Intravenous UD  . pantoprazole  40 mg Oral Daily  . peg 3350 powder  0.5 kit Oral Once   And  . [START ON 12/02/2019] peg 3350 powder  0.5 kit Oral Once    PRN meds:    Objective: Vitals:   12/01/19 1153 12/01/19 1415  BP: 127/67 (!) 114/57  Pulse: 65 67  Resp: 18 17  Temp: 98.3 F (36.8 C) 98.9 F (37.2 C)  SpO2: 100% 100%    Intake/Output Summary (Last 24 hours) at 12/01/2019 1429 Last data filed at 12/01/2019 1415 Gross per 24 hour  Intake 1807.74 ml  Output 2725 ml  Net -917.26 ml   Filed Weights   11/30/19 2100  Weight: 55.9 kg   Weight change:  Body mass index is 21.15 kg/m.   Physical Exam: General exam: Appears calm and comfortable.  Thin built Skin: No rashes, lesions or ulcers. HEENT: Atraumatic, normocephalic, supple neck, no obvious  bleeding Lungs: Clear to auscultation bilaterally CVS: Regular rate and rhythm, no murmur GI/Abd soft, nontender, nondistended, bowel sound present CNS: Alert, awake, oriented x3 Psychiatry: Mood appropriate Extremities: No pedal edema, no calf tenderness  Data Review: I have personally reviewed the laboratory data and studies available.  Recent Labs  Lab 11/30/19 1740 11/30/19 2127 12/01/19 0552 12/01/19 0850  WBC 15.2* 12.7* 13.3*  --   NEUTROABS 13.7*  --   --   --   HGB 2.5* 2.6* 6.8*  DUPLICATE REQUEST 6.9*  HCT 9.1* 9.3* 40.0*  DUPLICATE REQUEST 86.7*  MCV 63.6* 65.0* 76.7*  --   PLT  432* 381 338  --    Recent Labs  Lab 11/30/19 1740 11/30/19 2207 12/01/19 0120 12/01/19 0552  NA 134* 136 136 137  K 2.3* 2.5* 3.2* 4.1  CL 99 103 104 106  CO2 22 22 21* 21*  GLUCOSE 118* 107* 97 95  BUN _0 CREATININE 0.89 0.68 0.65 0.54  CALCIUM 8.5* 8.0* 8.1* 8.4*  MG 2.0  --   --   --   PHOS  --  2.2*  --   --     Terrilee Croak, MD  [facility] 12/01/2019

## 2019-12-02 ENCOUNTER — Inpatient Hospital Stay (HOSPITAL_COMMUNITY): Payer: Medicare HMO | Admitting: Certified Registered"

## 2019-12-02 ENCOUNTER — Encounter (HOSPITAL_COMMUNITY)
Admission: EM | Disposition: A | Discharge status: Home / Self Care | Payer: Self-pay | Source: Home / Self Care | Attending: Internal Medicine

## 2019-12-02 ENCOUNTER — Encounter (HOSPITAL_COMMUNITY): Payer: Self-pay | Admitting: *Deleted

## 2019-12-02 DIAGNOSIS — K2210 Ulcer of esophagus without bleeding: Secondary | ICD-10-CM

## 2019-12-02 DIAGNOSIS — K635 Polyp of colon: Secondary | ICD-10-CM

## 2019-12-02 DIAGNOSIS — K269 Duodenal ulcer, unspecified as acute or chronic, without hemorrhage or perforation: Secondary | ICD-10-CM

## 2019-12-02 DIAGNOSIS — K2970 Gastritis, unspecified, without bleeding: Secondary | ICD-10-CM

## 2019-12-02 DIAGNOSIS — K642 Third degree hemorrhoids: Secondary | ICD-10-CM

## 2019-12-02 HISTORY — PX: BIOPSY: SHX5522

## 2019-12-02 HISTORY — PX: ESOPHAGOGASTRODUODENOSCOPY (EGD) WITH PROPOFOL: SHX5813

## 2019-12-02 HISTORY — PX: COLONOSCOPY WITH PROPOFOL: SHX5780

## 2019-12-02 HISTORY — PX: POLYPECTOMY: SHX5525

## 2019-12-02 LAB — TYPE AND SCREEN
ABO/RH(D): O POS
Antibody Screen: NEGATIVE
Unit division: 0
Unit division: 0
Unit division: 0

## 2019-12-02 LAB — BPAM RBC
Blood Product Expiration Date: 202012302359
Blood Product Expiration Date: 202012302359
Blood Product Expiration Date: 202012302359
ISSUE DATE / TIME: 202012012133
ISSUE DATE / TIME: 202012020047
ISSUE DATE / TIME: 202012021133
Unit Type and Rh: 5100
Unit Type and Rh: 5100
Unit Type and Rh: 5100

## 2019-12-02 LAB — CBC
HCT: 28 % — ABNORMAL LOW (ref 36.0–46.0)
Hemoglobin: 8.7 g/dL — ABNORMAL LOW (ref 12.0–15.0)
MCH: 25.1 pg — ABNORMAL LOW (ref 26.0–34.0)
MCHC: 31.1 g/dL (ref 30.0–36.0)
MCV: 80.9 fL (ref 80.0–100.0)
Platelets: 292 10*3/uL (ref 150–400)
RBC: 3.46 MIL/uL — ABNORMAL LOW (ref 3.87–5.11)
RDW: 21.9 % — ABNORMAL HIGH (ref 11.5–15.5)
WBC: 19.2 10*3/uL — ABNORMAL HIGH (ref 4.0–10.5)
nRBC: 1.9 % — ABNORMAL HIGH (ref 0.0–0.2)

## 2019-12-02 LAB — FOLATE RBC
Folate, Hemolysate: 166 ng/mL
Folate, RBC: 1627 ng/mL (ref >498)
Hematocrit: 10.2 % — CL (ref 34.0–46.6)

## 2019-12-02 LAB — URINE CULTURE: Culture: <10000 — AB

## 2019-12-02 SURGERY — COLONOSCOPY WITH PROPOFOL
Anesthesia: Monitor Anesthesia Care

## 2019-12-02 MED ORDER — PHENYLEPHRINE HCL (PRESSORS) 10 MG/ML IV SOLN
INTRAVENOUS | Status: DC | PRN
  Administered 2019-12-02: 120 ug via INTRAVENOUS
  Administered 2019-12-02: 80 ug via INTRAVENOUS
  Administered 2019-12-02: 120 ug via INTRAVENOUS
  Administered 2019-12-02 (×2): 80 ug via INTRAVENOUS

## 2019-12-02 MED ORDER — LIDOCAINE HCL (CARDIAC) PF 100 MG/5ML IV SOSY
PREFILLED_SYRINGE | INTRAVENOUS | Status: DC | PRN
  Administered 2019-12-02: 50 mg via INTRATRACHEAL

## 2019-12-02 MED ORDER — SODIUM CHLORIDE 0.9 % IV SOLN
125.0000 mg | Freq: Once | INTRAVENOUS | Status: AC
  Administered 2019-12-02: 125 mg via INTRAVENOUS
  Filled 2019-12-02: qty 10

## 2019-12-02 MED ORDER — LACTATED RINGERS IV SOLN
INTRAVENOUS | Status: DC
  Administered 2019-12-02: 1000 mL via INTRAVENOUS
  Administered 2019-12-02: 12:00:00 via INTRAVENOUS

## 2019-12-02 MED ORDER — FERROUS SULFATE 325 (65 FE) MG PO TABS
325.0000 mg | ORAL_TABLET | Freq: Two times a day (BID) | ORAL | Status: DC
  Administered 2019-12-02 – 2019-12-04 (×4): 325 mg via ORAL
  Filled 2019-12-02 (×4): qty 1

## 2019-12-02 MED ORDER — EPHEDRINE SULFATE-NACL 50-0.9 MG/10ML-% IV SOSY
PREFILLED_SYRINGE | INTRAVENOUS | Status: DC | PRN
  Administered 2019-12-02 (×2): 5 mg via INTRAVENOUS

## 2019-12-02 MED ORDER — PROPOFOL 500 MG/50ML IV EMUL
INTRAVENOUS | Status: DC | PRN
  Administered 2019-12-02: 100 ug/kg/min via INTRAVENOUS

## 2019-12-02 MED ORDER — PROPOFOL 500 MG/50ML IV EMUL
INTRAVENOUS | Status: DC | PRN
  Administered 2019-12-02: 30 mg via INTRAVENOUS
  Administered 2019-12-02: 20 mg via INTRAVENOUS

## 2019-12-02 SURGICAL SUPPLY — 25 items

## 2019-12-02 NOTE — Interval H&P Note (Signed)
History and Physical Interval Note:  12/02/2019 11:30 AM  [place]  has presented today for surgery, with the diagnosis of iron deficiency anemia.  The various methods of treatment have been discussed with the patient and family. After consideration of risks, benefits and other options for treatment, the patient has consented to  Procedure(s): COLONOSCOPY WITH PROPOFOL (N/A) ESOPHAGOGASTRODUODENOSCOPY (EGD) WITH PROPOFOL (N/A) as a surgical intervention.  The patient's history has been reviewed, patient examined, no change in status, stable for surgery.  I have reviewed the patient's chart and labs.  Questions were answered to the patient's satisfaction.     Dominic Pea Tunisha Ruland

## 2019-12-02 NOTE — Progress Notes (Signed)
PROGRESS NOTE  Danielle Adkins  DOB: 1941-04-13  PCP: Patient, No Pcp Per [id number]  DOA: 11/30/2019  LOS: 1 day   Chief Complaint  Patient presents with  . Weakness   Brief narrative: Danielle Adkins is a 78 y.o. female with no known past medical history, and who has not seen a physician for several years.   Patient presented to the ED on 11/30/2019 with progressively worsening weakness. In the ED, patient was hemodynamically stable. Work-up showed hemoglobin severely low at 2.5 with MCV in 60s, potassium low at 2.3. Fecal occult blood test negative. 2 units of PRBCs transfused. Patient was admitted to hospital medicine service for further evaluation and management. GI consultation was called this morning.  Subjective: Patient was seen and examined this morning.  Not in distress.  Feels better after 3 units of PRBC transfusion since admission.  Patient underwent EGD and colonoscopy today.  Findings as below.    Assessment/Plan: Acute severe anemia Suspected GI bleeding -Presented with hemoglobin of 2.5.  2 units of PRBCs transfused.  Hemoglobin 8.7 this morning after a total of 3 units of PRBC transfusion. -Suspect slow intermittent GI bleeding over several months.   -Underwent EGD and colonoscopy today.  EGD showed esophageal ulcers with no stigmata of recent bleeding, LA grade B esophagitis with no bleeding, gastritis, nonbleeding duodenal ulcer with no stigmata of bleeding.  Colonoscopy showed 6 sessile polyps in the sigmoid colon, nonbleeding internal hemorrhoids. -Full liquid diet for today, continue Protonix 40 mg twice daily for 8 weeks.  Await pathology results. -Needs to repeat upper endoscopy in 8 weeks.  Severe iron deficiency -Likely because of slow intermittent GI bleeding.  Ferritin level very low at 4 only. -I will order for IV as well as oral iron replacement at this time.  Hypokalemia -Potassium level severely low at 2.3 on admission.  With replacement,  potassium level has improved to 4.1 on 12/2 -Continue to monitor daily labs.  Mobility: Encourage ambulation Diet: Full liquid diet for now per GI.  DVT prophylaxis:  SCDs Code Status:   Code Status: Full Code  Family Communication:  None at bedside Expected Discharge:  Anticipate 2 to 3 days of stay  Consultants:  GI  Procedures:  EGD and colonoscopy 12/2  Antimicrobials: Anti-infectives (From admission, onward)   None      Diet Order            Diet full liquid Room service appropriate? Yes; Fluid consistency: Thin  Diet effective now              Infusions:  . sodium chloride    . ferric gluconate (FERRLECIT/NULECIT) IV      Scheduled Meds: . Chlorhexidine Gluconate Cloth  6 each Topical Daily  . ferrous sulfate  325 mg Oral BID WC  . folic acid  1 mg Oral Daily  . furosemide  10 mg Intravenous UD  . pantoprazole  40 mg Oral Daily    PRN meds:    Objective: Vitals:   12/02/19 1300 12/02/19 1400  BP: (!) 129/49 (!) 146/59  Pulse: (!) 57   Resp: 15 19  Temp:    SpO2: 100%     Intake/Output Summary (Last 24 hours) at 12/02/2019 1410 Last data filed at 12/02/2019 1244 Gross per 24 hour  Intake 829.58 ml  Output -  Net 829.58 ml   Filed Weights   11/30/19 2100  Weight: 55.9 kg   Weight change:  Body mass index is 21.15 kg/m.  Physical Exam: General exam: Appears calm and comfortable.  Thin built Skin: No rashes, lesions or ulcers. HEENT: Atraumatic, normocephalic, supple neck, no obvious bleeding Lungs: Clear to auscultation bilaterally CVS: Regular rate and rhythm, no murmur GI/Abd soft, nontender, nondistended, bowel sound present CNS: Alert, awake, oriented x3 Psychiatry: Mood appropriate Extremities: No pedal edema, no calf tenderness  Data Review: I have personally reviewed the laboratory data and studies available.  Recent Labs  Lab 11/30/19 1740 11/30/19 2127 12/01/19 0552 12/01/19 0850 12/02/19 0231  WBC 15.2* 12.7*  13.3*  --  19.2*  NEUTROABS 13.7*  --   --   --   --   HGB 2.5* 2.6* 6.8*  DUPLICATE REQUEST 6.9* 8.7*  HCT 9.1* 9.3* 123456*  DUPLICATE REQUEST [id number]* 28.0*  MCV 63.6* 65.0* 76.7*  --  80.9  PLT 432* 381 338  --  292   Recent Labs  Lab 11/30/19 1740 11/30/19 2207 12/01/19 0120 12/01/19 0552  NA 134* 136 136 137  K 2.3* 2.5* 3.2* 4.1  CL 99 103 104 106  CO2 22 22 21* 21*  GLUCOSE 118* 107* 97 95  BUN 21 19 17 15   CREATININE 0.89 0.68 0.65 0.54  CALCIUM 8.5* 8.0* 8.1* 8.4*  MG 2.0  --   --   --   PHOS  --  2.2*  --   --     Terrilee Croak, MD  [facility] 12/02/2019

## 2019-12-02 NOTE — Op Note (Signed)
[facility] Patient Name: Danielle Adkins Procedure Date: 12/02/2019 MRN: [id number] Attending MD: Gerrit Heck , MD Date of Birth: [birth year] CSN: [id number] Age: 78 Admit Type: Inpatient Procedure:                Upper GI endoscopy Indications:              Iron deficiency anemia                           78 yo female admitted with symptomatic severe iron                            deficiency/foalte deficiency anemia, with admission                            Hgb 2.5 (MCV/RDW 63/19). FOBT-. Ferritin 4, iron 7,                            TIBC 414, iron sat 2%, foalte 5.4, B12 854.                            Transfused 4U pRBCs, with Hgb 8.7 this morning.                            Otherwise, she has remained hemodynamically stable.                            No prior history of GI bleed. No overt GI blood                            loss, abdominal pain. No previous EGD or                            colonoscopy. She has a hx of GERD which is                            otherwise controlled with omeprazole. Providers:                Gerrit Heck, MD, Jobe Igo, RN, Lina Sar, Technician,Ryan Telang CRNA, William Dalton, Technician Referring MD:              Medicines:                Monitored Anesthesia Care Complications:            No immediate complications. Estimated Blood Loss:     Estimated blood loss was minimal. Procedure:                Pre-Anesthesia Assessment:                           -  Prior to the procedure, a History and Physical                            was performed, and patient medications and                            allergies were reviewed. The patient's tolerance of                            previous anesthesia was also reviewed. The risks                            and benefits of the procedure and the sedation                            options and risks were discussed with  the patient.                            All questions were answered, and informed consent                            was obtained. Prior Anticoagulants: The patient has                            taken no previous anticoagulant or antiplatelet                            agents. ASA Grade Assessment: II - A patient with                            mild systemic disease. After reviewing the risks                            and benefits, the patient was deemed in                            satisfactory condition to undergo the procedure.                           After obtaining informed consent, the endoscope was                            passed under direct vision. Throughout the                            procedure, the patient's blood pressure, pulse, and                            oxygen saturations were monitored continuously. The                            [id number] Olympus gastroscope was  introduced through the mouth, and advanced to the                            second part of duodenum. The upper GI endoscopy was                            accomplished without difficulty. The patient                            tolerated the procedure well. Scope In: Scope Out: Findings:      Many cratered esophageal ulcers with no stigmata of recent bleeding were       found in the entire esophagus. The largest lesion was 5 mm in largest       dimension. Biopsies were taken with a cold forceps for histology.       Estimated blood loss was minimal.      LA Grade B (one or more mucosal breaks greater than 5 mm, not extending       between the tops of two mucosal folds) esophagitis with no bleeding was       found in the lower third of the esophagus. Biopsies were taken with a       cold forceps for histology. Estimated blood loss was minimal.      Scattered mild inflammation characterized by congestion (edema) and       erythema was found in the gastric body, at  the incisura and in the       gastric antrum. There was a single, non-bleeding erosion in the       incisura. Several mucosal biopsies were taken from the erosion and       throughout the stomach with a cold forceps for Helicobacter pylori       testing. Estimated blood loss was minimal.      One non-bleeding cratered duodenal ulcer with no stigmata of bleeding       was found in the duodenal bulb. The lesion was 5 mm in largest       dimension. Biopsies were taken with a cold forceps for histology.       Estimated blood loss was minimal.      The first portion of the duodenum and second portion of the duodenum       were normal. Biopsies for histology were taken with a cold forceps for       evaluation of celiac disease. Estimated blood loss was minimal. Impression:               - Esophageal ulcers with no stigmata of recent                            bleeding. Biopsied.                           - LA Grade B esophagitis with no bleeding. Biopsied.                           - Gastritis. Biopsied.                           - Non-bleeding duodenal ulcer with no stigmata of  bleeding. Biopsied.                           - Normal first portion of the duodenum and second                            portion of the duodenum. Biopsied. Moderate Sedation:      Not Applicable - Patient had care per Anesthesia. Recommendation:           - Return patient to hospital ward for ongoing care.                           - Full liquid diet today.                           - Continue present medications.                           - Use Protonix (pantoprazole) 40 mg PO BID for 8                            weeks.                           - Await pathology results.                           - Repeat upper endoscopy in 8 weeks to check                            healing.                           - Perform a colonoscopy today.                           - Resume serial Hgb/Hct  checks.                           - IV iron prior to discharge.                           - Resume oral iron therapy and folic acid 1 mg/day                            at time of discharge. Take oral iron with Vitamin C                            to aid in absorption. Procedure Code(s):        --- Professional ---                           [phone], Esophagogastroduodenoscopy, flexible,                            transoral; with biopsy, single or multiple Diagnosis Code(s):        ---  Professional ---                           K22.10, Ulcer of esophagus without bleeding                           K20.90, Esophagitis, unspecified without bleeding                           K29.70, Gastritis, unspecified, without bleeding                           K26.9, Duodenal ulcer, unspecified as acute or                            chronic, without hemorrhage or perforation                           D50.9, Iron deficiency anemia, unspecified CPT copyright 2019 [employer]. All rights reserved. The codes documented in this report are preliminary and upon coder review may  be revised to meet current compliance requirements. Gerrit Heck, MD 12/02/2019 12:47:22 PM Number of Addenda: 0

## 2019-12-02 NOTE — Anesthesia Postprocedure Evaluation (Signed)
Anesthesia Post Note  Patient: Danielle Adkins  Procedure(s) Performed: COLONOSCOPY WITH PROPOFOL (N/A ) ESOPHAGOGASTRODUODENOSCOPY (EGD) WITH PROPOFOL (N/A ) BIOPSY     Patient location during evaluation: PACU Anesthesia Type: MAC Level of consciousness: awake and alert Pain management: pain level controlled Vital Signs Assessment: post-procedure vital signs reviewed and stable Respiratory status: spontaneous breathing Cardiovascular status: stable Anesthetic complications: no    Last Vitals:  Vitals:   12/02/19 1300 12/02/19 1400  BP: (!) 129/49 (!) 146/59  Pulse: (!) 57   Resp: 15 19  Temp:    SpO2: 100%     Last Pain:  Vitals:   12/02/19 1300  TempSrc:   PainSc: 0-No pain                 Nolon Nations

## 2019-12-02 NOTE — Op Note (Signed)
[facility] Patient Name: Danielle Adkins Procedure Date: 12/02/2019 MRN: [id number] Attending MD: Gerrit Heck , MD Date of Birth: Apr 03, 1941 CSN: [id number] Age: 78 Admit Type: Inpatient Procedure:                Colonoscopy Indications:              Iron deficiency anemia                           78 yo female admitted with symptomatic severe iron                            deficiency/foalte deficiency anemia, with admission                            Hgb 2.5 (MCV/RDW 63/19). FOBT-. Ferritin 4, iron 7,                            TIBC 414, iron sat 2%, foalte 5.4, B12 854.                            Transfused 4U pRBCs, with Hgb 8.7 this morning.                            Otherwise, she has remained hemodynamically stable.                            No prior history of GI bleed. No overt GI blood                            loss, abdominal pain. No previous EGD or                            colonoscopy. She has a hx of GERD which is                            otherwise controlled with omeprazole. Providers:                Gerrit Heck, MD, Jobe Igo, RN, Lina Sar, Marin Shutter CRNA, William Dalton, Technician Referring MD:              Medicines:                Monitored Anesthesia Care Complications:            No immediate complications. Estimated Blood Loss:     Estimated blood loss was minimal. Procedure:                Pre-Anesthesia Assessment:                           - Prior to  the procedure, a History and Physical                            was performed, and patient medications and                            allergies were reviewed. The patient's tolerance of                            previous anesthesia was also reviewed. The risks                            and benefits of the procedure and the sedation                            options and risks were discussed with the  patient.                            All questions were answered, and informed consent                            was obtained. Prior Anticoagulants: The patient has                            taken no previous anticoagulant or antiplatelet                            agents. ASA Grade Assessment: II - A patient with                            mild systemic disease. After reviewing the risks                            and benefits, the patient was deemed in                            satisfactory condition to undergo the procedure.                           After obtaining informed consent, the colonoscope                            was passed under direct vision. Throughout the                            procedure, the patient's blood pressure, pulse, and                            oxygen saturations were monitored continuously. The                            [id number] [id number] Olympus colonoscope was  introduced through the anus and advanced to the 10                            cm into the ileum. The colonoscopy was performed                            without difficulty. The patient tolerated the                            procedure well. The quality of the bowel                            preparation was fair. The terminal ileum, ileocecal                            valve, appendiceal orifice, and rectum were                            photographed. Scope In: 12:08:11 PM Scope Out: 12:31:44 PM Scope Withdrawal Time: 0 hours 19 minutes 3 seconds  Total Procedure Duration: 0 hours 23 minutes 33 seconds  Findings:      Hemorrhoids were found on perianal exam.      Six sessile polyps were found in the sigmoid colon, transverse colon and       ascending colon. The polyps were 4 to 8 mm in size. These polyps were       removed with a cold snare. Resection and retrieval were complete.       Estimated blood loss was minimal.      A moderate amount of semi-liquid  stool was found in the entire colon,       interfering with visualization. Lavage of the area was performed using       copious amounts of sterile water, resulting in clearance with fair       visualization. Cannout rule out the presence of small or flat polyps in       these areas, but the prep was adequate enough to rule out mass lesions,       bleeding. No blood noted throughout the colonic lumen or in the terminal       ileum.      Non-bleeding internal hemorrhoids were found during retroflexion and       during perianal exam. The hemorrhoids were medium-sized and Grade III       (internal hemorrhoids that prolapse but require manual reduction).      The terminal ileum appeared normal. Impression:               - Preparation of the colon was fair.                           - Hemorrhoids found on perianal exam.                           - Six 4 to 8 mm polyps in the sigmoid colon, in the                            transverse colon and in the  ascending colon,                            removed with a cold snare. Resected and retrieved.                           - Stool in the entire examined colon.                           - Non-bleeding internal hemorrhoids.                           - The examined portion of the ileum was normal. Moderate Sedation:      Not Applicable - Patient had care per Anesthesia. Recommendation:           - Return patient to hospital ward for ongoing care.                           - Full liquid diet today.                           - Continue present medications.                           - Await pathology results.                           - Return to GI clinic at appointment to be                            scheduled.                           - Please see EGD report for additional                            recommendations regarding ongoing inpatient and                            outpatient management. Procedure Code(s):        --- Professional ---                            [phone], Colonoscopy, flexible; with removal of                            tumor(s), polyp(s), or other lesion(s) by snare                            technique Diagnosis Code(s):        --- Professional ---                           [id number], Third degree hemorrhoids                           K63.5, Polyp of colon  D50.9, Iron deficiency anemia, unspecified CPT copyright 2019 [employer]. All rights reserved. The codes documented in this report are preliminary and upon coder review may  be revised to meet current compliance requirements. Gerrit Heck, MD 12/02/2019 12:59:40 PM Number of Addenda: 0

## 2019-12-02 NOTE — Transfer of Care (Signed)
Immediate Anesthesia Transfer of Care Note  Patient: Danielle Adkins  Procedure(s) Performed: COLONOSCOPY WITH PROPOFOL (N/A ) ESOPHAGOGASTRODUODENOSCOPY (EGD) WITH PROPOFOL (N/A ) BIOPSY  Patient Location: Endoscopy Unit  Anesthesia Type:MAC  Level of Consciousness: drowsy, patient cooperative and responds to stimulation  Airway & Oxygen Therapy: Patient Spontanous Breathing and Patient connected to face mask oxygen  Post-op Assessment: Report given to RN and Post -op Vital signs reviewed and stable  Post vital signs: Reviewed and stable  Last Vitals:  Vitals Value Taken Time  BP 95/46 12/02/19 1242  Temp    Pulse 48 12/02/19 1243  Resp 20 12/02/19 1243  SpO2 100 % 12/02/19 1243  Vitals shown include unvalidated device data.  Last Pain:  Vitals:   12/02/19 1242  TempSrc: Temporal  PainSc:          Complications: No apparent anesthesia complications

## 2019-12-02 NOTE — Anesthesia Preprocedure Evaluation (Signed)
Anesthesia Evaluation  Patient identified by MRN, date of birth, ID band Patient awake    Reviewed: Allergy & Precautions, NPO status , Patient's Chart, lab work & pertinent test results  Airway Mallampati: II  TM Distance: >3 FB Neck ROM: Full    Dental  (+) Dental Advisory Given   Pulmonary neg pulmonary ROS,    Pulmonary exam normal breath sounds clear to auscultation       Cardiovascular negative cardio ROS Normal cardiovascular exam Rhythm:Regular Rate:Normal     Neuro/Psych negative neurological ROS  negative psych ROS   GI/Hepatic Neg liver ROS, GERD  ,  Endo/Other  negative endocrine ROS  Renal/GU negative Renal ROS     Musculoskeletal negative musculoskeletal ROS (+)   Abdominal   Peds  Hematology negative hematology ROS (+) anemia ,   Anesthesia Other Findings   Reproductive/Obstetrics negative OB ROS                             Anesthesia Physical Anesthesia Plan  ASA: II  Anesthesia Plan: MAC   Post-op Pain Management:    Induction: Intravenous  PONV Risk Score and Plan: 2 and Propofol infusion, Ondansetron and Treatment may vary due to age or medical condition  Airway Management Planned:   Additional Equipment:   Intra-op Plan:   Post-operative Plan:   Informed Consent: I have reviewed the patients History and Physical, chart, labs and discussed the procedure including the risks, benefits and alternatives for the proposed anesthesia with the patient or authorized representative who has indicated his/her understanding and acceptance.     Dental advisory given  Plan Discussed with: CRNA  Anesthesia Plan Comments:         Anesthesia Quick Evaluation

## 2019-12-03 ENCOUNTER — Encounter: Payer: Self-pay | Admitting: Gastroenterology

## 2019-12-03 ENCOUNTER — Other Ambulatory Visit: Payer: Self-pay

## 2019-12-03 ENCOUNTER — Encounter (HOSPITAL_COMMUNITY): Payer: Self-pay | Admitting: Gastroenterology

## 2019-12-03 LAB — CBC
HCT: 24.1 % — ABNORMAL LOW (ref 36.0–46.0)
Hemoglobin: 7.5 g/dL — ABNORMAL LOW (ref 12.0–15.0)
MCH: 24.9 pg — ABNORMAL LOW (ref 26.0–34.0)
MCHC: 31.1 g/dL (ref 30.0–36.0)
MCV: 80.1 fL (ref 80.0–100.0)
Platelets: 229 10*3/uL (ref 150–400)
RBC: 3.01 MIL/uL — ABNORMAL LOW (ref 3.87–5.11)
RDW: 22.6 % — ABNORMAL HIGH (ref 11.5–15.5)
WBC: 11.8 10*3/uL — ABNORMAL HIGH (ref 4.0–10.5)
nRBC: 2.1 % — ABNORMAL HIGH (ref 0.0–0.2)

## 2019-12-03 LAB — BASIC METABOLIC PANEL
Anion gap: 9 (ref 5–15)
BUN: 5 mg/dL — ABNORMAL LOW (ref 8–23)
CO2: 21 mmol/L — ABNORMAL LOW (ref 22–32)
Calcium: 8.3 mg/dL — ABNORMAL LOW (ref 8.9–10.3)
Chloride: 106 mmol/L (ref 98–111)
Creatinine, Ser: 0.41 mg/dL — ABNORMAL LOW (ref 0.44–1.00)
GFR calc Af Amer: >60 mL/min (ref >60)
GFR calc non Af Amer: >60 mL/min (ref >60)
Glucose, Bld: 90 mg/dL (ref 70–99)
Potassium: 3 mmol/L — ABNORMAL LOW (ref 3.5–5.1)
Sodium: 136 mmol/L (ref 135–145)

## 2019-12-03 MED ORDER — POTASSIUM CHLORIDE 10 MEQ/100ML IV SOLN
10.0000 meq | INTRAVENOUS | Status: AC
  Administered 2019-12-03 (×2): 10 meq via INTRAVENOUS
  Filled 2019-12-03 (×2): qty 100

## 2019-12-03 MED ORDER — BISMUTH SUBSALICYLATE 262 MG PO CHEW
524.0000 mg | CHEWABLE_TABLET | Freq: Three times a day (TID) | ORAL | Status: DC
  Administered 2019-12-03 – 2019-12-04 (×4): 524 mg via ORAL
  Filled 2019-12-03 (×7): qty 2

## 2019-12-03 MED ORDER — PANTOPRAZOLE SODIUM 40 MG PO TBEC
40.0000 mg | DELAYED_RELEASE_TABLET | Freq: Two times a day (BID) | ORAL | Status: DC
  Administered 2019-12-03 – 2019-12-04 (×2): 40 mg via ORAL
  Filled 2019-12-03 (×2): qty 1

## 2019-12-03 MED ORDER — INFLUENZA VAC A&B SA ADJ QUAD 0.5 ML IM PRSY
0.5000 mL | PREFILLED_SYRINGE | INTRAMUSCULAR | Status: AC | PRN
  Administered 2019-12-04: 16:00:00 0.5 mL via INTRAMUSCULAR
  Filled 2019-12-03 (×2): qty 0.5

## 2019-12-03 MED ORDER — PNEUMOCOCCAL VAC POLYVALENT 25 MCG/0.5ML IJ INJ
0.5000 mL | INJECTION | INTRAMUSCULAR | Status: AC | PRN
  Administered 2019-12-04: 0.5 mL via INTRAMUSCULAR
  Filled 2019-12-03 (×2): qty 0.5

## 2019-12-03 MED ORDER — DOXYCYCLINE HYCLATE 100 MG PO TABS
100.0000 mg | ORAL_TABLET | Freq: Two times a day (BID) | ORAL | Status: DC
  Administered 2019-12-03 – 2019-12-04 (×2): 100 mg via ORAL
  Filled 2019-12-03 (×3): qty 1

## 2019-12-03 MED ORDER — METRONIDAZOLE 500 MG PO TABS
250.0000 mg | ORAL_TABLET | Freq: Four times a day (QID) | ORAL | Status: DC
  Administered 2019-12-03 – 2019-12-04 (×4): 250 mg via ORAL
  Filled 2019-12-03 (×5): qty 1

## 2019-12-03 MED ORDER — HYDROCORTISONE 0.5 % EX CREA
TOPICAL_CREAM | Freq: Two times a day (BID) | CUTANEOUS | Status: DC | PRN
  Administered 2019-12-04: 1 via TOPICAL
  Filled 2019-12-03: qty 28.35

## 2019-12-03 MED ORDER — ACYCLOVIR 400 MG PO TABS
400.0000 mg | ORAL_TABLET | Freq: Three times a day (TID) | ORAL | Status: DC
  Administered 2019-12-03 – 2019-12-04 (×4): 400 mg via ORAL
  Filled 2019-12-03 (×5): qty 1

## 2019-12-03 NOTE — Progress Notes (Signed)
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 5) Benign hyperplastic and inflammatory colon polyps  Will plan for the following: 1) HSV Esophagitis - Acyclovir 400 mg TID  2) H pylori: Initiate quad therapy with 1) Continued Protonix 40 mg PO BID 2) Pepto Bismol 2 tabs (262 mg each) 4 times a day x 14 d 3) Metronidazole 250 mg 4 times a day x 14 d 4) doxycycline 100 mg 2 times a day x 14 d  - Repeat EGD in 8 weeks to confirm mucosal healing of ulcers and will repeat gastric biopsies at that time to confirm eradication of H pylori  Given age, repeat colonoscopy for polyp surveillance can be discussed on an individualized basis as an outpatient when she follows up.

## 2019-12-03 NOTE — Progress Notes (Signed)
PROGRESS NOTE  Danielle Adkins  DOB: 1941/03/18  PCP: Patient, No Pcp Per [id number]  DOA: 11/30/2019  LOS: 2 days   Chief Complaint  Patient presents with  . Weakness   Brief narrative: Danielle Adkins is a 78 y.o. female with no known past medical history, and who has not seen a physician for several years.   Patient presented to the ED on 11/30/2019 with progressively worsening weakness. In the ED, patient was hemodynamically stable. Work-up showed hemoglobin severely low at 2.5 with MCV in 60s, potassium low at 2.3. Fecal occult blood test negative. 2 units of PRBCs transfused. Patient was admitted to hospital medicine service for further evaluation and management. GI consultation was called this morning.  Subjective: Patient was seen and examined this morning.  Not in distress. No bleeding noted.  Hemoglobin dropped this morning to 7.5.  Potassium low at 3.  On full liquid diet  Assessment/Plan: Acute severe anemia Suspected GI bleeding -Presented with hemoglobin of 2.5.  3 units of PRBCs transfused.   -Slow intermittent GI bleeding over several months.   -12/3, underwent EGD and colonoscopy. EGD showed esophageal ulcers with no stigmata of recent bleeding, LA grade B esophagitis with no bleeding, gastritis, nonbleeding duodenal ulcer with no stigmata of bleeding.  Colonoscopy showed 6 sessile polyps in the sigmoid colon, nonbleeding internal hemorrhoids. -This morning, patient has no evidence of active bleeding.  Her hemoglobin dropped from 8.7 to 7.5.  GI follow-up appreciated.  Repeat hemoglobin tomorrow.   -Diet advanced to regular.  Continue Protonix 40 mg twice daily for 8 weeks.  Pathology report awaited.   -Needs to repeat upper endoscopy in 8 weeks.  Severe iron deficiency -Likely because of slow intermittent GI bleeding.  Ferritin level very low at 4 only. -IV iron replacement given in the hospital.  Oral replacement started as well.  Hypokalemia -Potassium level  severely low at 2.3 on admission.  Replacement given, low at 3 this morning.  Furthermore replacement given.  Mobility: Encourage ambulation Diet: Full liquid diet for now per GI. DVT prophylaxis:  SCDs Code Status:   Code Status: Full Code  Family Communication:  None at bedside Expected Discharge:  If hemoglobin stable, patient may be able to go home tomorrow.  Consultants:  GI  Procedures:  EGD and colonoscopy 12/2  Antimicrobials: Anti-infectives (From admission, onward)   None      Diet Order            Diet regular Room service appropriate? Yes; Fluid consistency: Thin  Diet effective now              Infusions:  . sodium chloride      Scheduled Meds: . Chlorhexidine Gluconate Cloth  6 each Topical Daily  . ferrous sulfate  325 mg Oral BID WC  . folic acid  1 mg Oral Daily  . furosemide  10 mg Intravenous UD  . pantoprazole  40 mg Oral BID    PRN meds:    Objective: Vitals:   12/03/19 0800 12/03/19 1221  BP: (!) 103/49   Pulse: 60   Resp: 14   Temp:  98.4 F (36.9 C)  SpO2: 99%     Intake/Output Summary (Last 24 hours) at 12/03/2019 1411 Last data filed at 12/03/2019 0618 Gross per 24 hour  Intake 690 ml  Output -  Net 690 ml   Filed Weights   11/30/19 2100  Weight: 55.9 kg   Weight change:  Body mass index is 21.15 kg/m.  Physical Exam: General exam: Appears calm and comfortable.  Thin built Skin: No rashes, lesions or ulcers. HEENT: Atraumatic, normocephalic, supple neck, no obvious bleeding Lungs: Clear to auscultation bilaterally CVS: Regular rate and rhythm, no murmur GI/Abd soft, nontender, nondistended, bowel sound present CNS: Alert, awake, oriented x3 Psychiatry: Mood appropriate Extremities: No pedal edema, no calf tenderness  Data Review: I have personally reviewed the laboratory data and studies available.  Recent Labs  Lab 11/30/19 1740 11/30/19 2127 11/30/19 2207 12/01/19 0552 12/01/19 0850 12/02/19 0231  12/03/19 0207  WBC 15.2* 12.7*  --  13.3*  --  19.2* 11.8*  NEUTROABS 13.7*  --   --   --   --   --   --   HGB 2.5* 2.6*  --  6.8*  DUPLICATE REQUEST 6.9* 8.7* 7.5*  HCT 9.1* 9.3* [id number]* [id number]*  DUPLICATE REQUEST [id number]* 28.0* 24.1*  MCV 63.6* 65.0*  --  76.7*  --  80.9 80.1  PLT 432* 381  --  338  --  292 229   Recent Labs  Lab 11/30/19 1740 11/30/19 2207 12/01/19 0120 12/01/19 0552 12/03/19 0207  NA 134* 136 136 137 136  K 2.3* 2.5* 3.2* 4.1 3.0*  CL 99 103 104 106 106  CO2 22 22 21* 21* 21*  GLUCOSE 118* 107* 97 95 90  BUN 21 19 17 15  5*  CREATININE 0.89 0.68 0.65 0.54 0.41*  CALCIUM 8.5* 8.0* 8.1* 8.4* 8.3*  MG 2.0  --   --   --   --   PHOS  --  2.2*  --   --   --     Terrilee Croak, MD  [facility] 12/03/2019

## 2019-12-03 NOTE — Progress Notes (Signed)
      Progress Note   Subjective  Patient doing well, tolerating clears. No pains. No complaints. Pleasant.   Objective   Vital signs in last 24 hours: Temp:  [97.9 F (36.6 C)-98.5 F (36.9 C)] 98.4 F (36.9 C) (12/04 1221) Pulse Rate:  [51-70] 60 (12/04 0800) Resp:  [14-22] 14 (12/04 0800) BP: (96-146)/(40-65) 103/49 (12/04 0800) SpO2:  [65 %-100 %] 99 % (12/04 0800) Last BM Date: 12/02/19 General:    AA female in NAD Heart:  Regular rate and rhythm; n Lungs: Respirations even and unlabored, lungs CTA bilaterally Abdomen:  Soft, nontender and nondistended.  Extremities:  Without edema. Neurologic:  Alert and oriented,  grossly normal neurologically. Psych:  Cooperative. Normal mood and affect.  Intake/Output from previous day: 12/03 0701 - 12/04 0700 In: [id number] [P.O.:690; I.V.:400] Out: -  Intake/Output this shift: No intake/output data recorded.  Lab Results: Recent Labs    12/01/19 0552 12/01/19 0850 12/02/19 0231 12/03/19 0207  WBC 13.3*  --  19.2* [id number]*  HGB 6.8*  DUPLICATE REQUEST 6.9* 8.7* 7.5*  HCT 123456*  DUPLICATE REQUEST [id number]* 28.0* 24.1*  PLT 338  --  292 229   BMET Recent Labs    12/01/19 0120 12/01/19 0552 12/03/19 0207  NA 136 137 136  K 3.2* 4.1 3.0*  CL 104 106 106  CO2 21* 21* 21*  GLUCOSE 97 95 90  BUN 17 15 5*  CREATININE 0.65 0.54 0.41*  CALCIUM 8.1* 8.4* 8.3*   LFT Recent Labs    11/30/19 1740  PROT 6.7  ALBUMIN 3.2*  AST 10*  ALT 11  ALKPHOS 60  BILITOT 0.4   PT/INR No results for input(s): LABPROT, INR in the last 72 hours.  Studies/Results: No results found.     Assessment / Plan:    78 y/o female who presented with profound iron deficiency anemia, history of some dark stools remotely but nothing recently. EGD and colonoscopy done per Dr. Bryan Lemma yesterday. She had some benign appearing polyps removed during colonoscopy, and had gastritis and multiple small esophageal ulcerations, as well as a clean based  duodenal ulcer. Biopsies taken of the esophagus and stomach. She has no odynophagia and feels well. Hgb dropped slightly overnight but suspect equilibration, she's had no active bleeding, has had a few doses of IV iron.   At this point recommend 40mg  twice daily for the next 1 month and then reduce to once daily. Avoid all NSAIDs. Advance diet to regular. We will be in touch with biopsies next week with further recommendations. I think if Hgb stable in the AM she can go home.   Call with questions, we will sign off for now.   [place] Cellar, MD [facility]

## 2019-12-04 DIAGNOSIS — D500 Iron deficiency anemia secondary to blood loss (chronic): Secondary | ICD-10-CM

## 2019-12-04 LAB — CBC
HCT: 25.4 % — ABNORMAL LOW (ref 36.0–46.0)
Hemoglobin: 7.5 g/dL — ABNORMAL LOW (ref 12.0–15.0)
MCH: 24.6 pg — ABNORMAL LOW (ref 26.0–34.0)
MCHC: 29.5 g/dL — ABNORMAL LOW (ref 30.0–36.0)
MCV: 83.3 fL (ref 80.0–100.0)
Platelets: 246 10*3/uL (ref 150–400)
RBC: 3.05 MIL/uL — ABNORMAL LOW (ref 3.87–5.11)
RDW: 23.4 % — ABNORMAL HIGH (ref 11.5–15.5)
WBC: 13.2 10*3/uL — ABNORMAL HIGH (ref 4.0–10.5)
nRBC: 1.1 % — ABNORMAL HIGH (ref 0.0–0.2)

## 2019-12-04 MED ORDER — METRONIDAZOLE 250 MG PO TABS: 250.0000 mg | ORAL_TABLET | Freq: Four times a day (QID) | ORAL | 0 refills | Status: AC

## 2019-12-04 MED ORDER — RISAQUAD PO CAPS: 1.0000 | ORAL_CAPSULE | Freq: Every day | ORAL | 0 refills | Status: AC

## 2019-12-04 MED ORDER — RISAQUAD PO CAPS
1.0000 | ORAL_CAPSULE | Freq: Every day | ORAL | Status: DC
  Administered 2019-12-04: 1 via ORAL
  Filled 2019-12-04: qty 1

## 2019-12-04 MED ORDER — BISMUTH SUBSALICYLATE 262 MG PO CHEW: 524.0000 mg | CHEWABLE_TABLET | Freq: Three times a day (TID) | ORAL | 0 refills | Status: AC

## 2019-12-04 MED ORDER — DOXYCYCLINE HYCLATE 100 MG PO TABS: 100.0000 mg | ORAL_TABLET | Freq: Two times a day (BID) | ORAL | 0 refills | Status: AC

## 2019-12-04 MED ORDER — POTASSIUM CHLORIDE CRYS ER 20 MEQ PO TBCR
40.0000 meq | EXTENDED_RELEASE_TABLET | Freq: Once | ORAL | Status: AC
  Administered 2019-12-04: 40 meq via ORAL
  Filled 2019-12-04: qty 2

## 2019-12-04 MED ORDER — FERROUS SULFATE 325 (65 FE) MG PO TABS: 325.0000 mg | ORAL_TABLET | Freq: Two times a day (BID) | ORAL | 0 refills | Status: DC

## 2019-12-04 MED ORDER — PANTOPRAZOLE SODIUM 40 MG PO TBEC: 40.0000 mg | DELAYED_RELEASE_TABLET | Freq: Two times a day (BID) | ORAL | 0 refills | Status: DC

## 2019-12-04 MED ORDER — FOLIC ACID 1 MG PO TABS: 1.0000 mg | ORAL_TABLET | Freq: Every day | ORAL | 0 refills | Status: AC

## 2019-12-04 MED ORDER — ACYCLOVIR 400 MG PO TABS: 400.0000 mg | ORAL_TABLET | Freq: Three times a day (TID) | ORAL | 0 refills | Status: AC

## 2019-12-04 NOTE — Discharge Summary (Signed)
Physician Discharge Summary  Huberta Luskin [id number] DOB: 09-Jan-1941 DOA: 11/30/2019  PCP: Patient, No Pcp Per  Admit date: 11/30/2019 Discharge date: 12/04/2019  Admitted From: Home Discharge disposition: Home   Code Status: Full Code  Diet Recommendation: Regular diet   Recommendations for Outpatient Follow-Up:   1. Follow-up with PCP as an outpatient 2. Follow-up with GI as an outpatient  Discharge Diagnosis:   Active Problems:   Symptomatic anemia   Iron deficiency anemia   Grade III hemorrhoids  History of Present Illness / Brief narrative:  Danielle Adkins is a 78 y.o. female with no known past medical history, and who has not seen a physician for several years.   Patient presented to the ED on 11/30/2019 with progressively worsening weakness. In the ED, patient was hemodynamically stable. Work-up showed hemoglobin severely low at 2.5 with MCV in 60s, potassium low at 2.3. Fecal occult blood test negative. Patient was admitted to hospital medicine service for further evaluation and management. GI consultation was called this morning.  Hospital Course:  Acute severe anemia Acute on chronic GI bleeding -Presented with hemoglobin of 2.5. 3 units of PRBCs transfused altogether. -Patient likely had acute on chronic intermittent GI bleeding over several months.   -12/3, underwent EGD and colonoscopy. EGD showed esophageal ulcers with no stigmata of recent bleeding, LA grade B esophagitis with no bleeding, gastritis, nonbleeding duodenal ulcer with no stigmata of bleeding.  Colonoscopy showed 6 sessile polyps in the sigmoid colon, nonbleeding internal hemorrhoids. -Patient has no further episodes of bleeding. 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  Ferritin level very low at 4 only. -IV iron replacement given in the hospital.  Oral replacement started as well.  Hypokalemia -replaced.  Stable for discharge to home today.  Subjective:  Seen and examined this morning.  Pleasant elderly African-American female.  Not in distress.  No new symptoms.  I discussed with her son over the phone and updated him about the diagnosis, plan and need to follow-up.  Discharge Exam:   Vitals:   12/03/19 1700 12/03/19 1827 12/03/19 2023 12/04/19 0552  BP: 137/84 (!) 111/57 (!) 94/48 (!) 106/56  Pulse: 74 71 72 69  Resp: 19 18 17 18   Temp:  98.7 F (37.1 C) 98.9 F (37.2 C) 98.8 F (37.1 C)  TempSrc:  Oral  Oral  SpO2: 100% 100% 100% 99%  Weight:      Height:        Body mass index is 21.15 kg/m.  General exam: Appears calm and comfortable.  Skin: No rashes, lesions or ulcers. HEENT: Atraumatic, normocephalic, supple neck, no obvious bleeding Lungs: Clear to auscultation bilaterally CVS: Regular rate and rhythm,  no murmur GI/Abd soft, nontender, nondistended, bowel sound present CNS: Alert, awake and oriented x3 Psychiatry: Mood appropriate Extremities: No pedal edema, no calf tenderness  Discharge Instructions:  Wound care: none Discharge Instructions    Diet general   Complete by: As directed    Increase activity slowly   Complete by: As directed      Follow-up Information    Cirigliano, [place], DO Follow up.   Specialty: Gastroenterology Contact information: [place] [address] [phone]          Allergies as of 12/04/2019   No Known Allergies     Medication List    STOP taking these medications   omeprazole 20 MG tablet Commonly known as: PRILOSEC OTC Replaced by: pantoprazole 40 MG tablet     TAKE these medications   acidophilus Caps capsule Take 1 capsule by mouth daily for 14 days. Start taking on: December 05, 2019   acyclovir 400 MG tablet Commonly known as: ZOVIRAX Take 1 tablet (400 mg total) by mouth 3 (three) times daily for 7 days.   bismuth subsalicylate 99991111 MG chewable tablet Commonly known as: PEPTO BISMOL Chew 2 tablets (524 mg total) by mouth 4 (four) times daily -  before meals and at bedtime for 14 days.   doxycycline 100 MG tablet Commonly known as: VIBRA-TABS Take 1 tablet (100 mg total) by mouth every 12 (twelve) hours for 14 days.   ferrous sulfate 325 (65 FE) MG tablet Take 1 tablet (325 mg total) by mouth 2 (two) times daily with a meal.   folic acid 1 MG tablet Commonly known as: FOLVITE Take 1 tablet (1 mg total) by mouth daily. Start taking on: December 05, 2019   ibuprofen 200 MG tablet Commonly known as: ADVIL Take 200 mg by mouth every 6 (six) hours as needed for fever or moderate pain.   metroNIDAZOLE 250 MG tablet Commonly known as: FLAGYL Take 1 tablet (250 mg total) by mouth every 6 (six) hours for 14 days.   pantoprazole 40 MG tablet Commonly known as: PROTONIX Take 1 tablet (40 mg total) by mouth 2 (two) times daily. Replaces: omeprazole 20 MG tablet       Time coordinating discharge: 35 minutes  The results of significant diagnostics from this hospitalization (including imaging, microbiology, ancillary and laboratory) are listed below for reference.    Procedures and Diagnostic Studies:   No results found.   Labs:   Basic Metabolic Panel: Recent Labs  Lab  11/30/19 1740 11/30/19 2207 12/01/19 0120 12/01/19 0552 12/03/19 0207  NA 134* 136 136 137 136  K 2.3* 2.5* 3.2* 4.1 3.0*  CL 99 103 104 106 106  CO2 22 22 21* 21* 21*  GLUCOSE 118* 107* 97 95 90  BUN 21 19 17 15  5*  CREATININE 0.89 0.68 0.65 0.54 0.41*  CALCIUM 8.5* 8.0* 8.1* 8.4* 8.3*  MG 2.0  --   --   --   --   PHOS  --  2.2*  --   --   --    GFR Estimated Creatinine Clearance: 48.4 mL/min (A) (by C-G formula based on SCr of 0.41 mg/dL (L)). Liver Function Tests: Recent Labs  Lab 11/30/19 1740  AST 10*  ALT 11  ALKPHOS 60  BILITOT 0.4  PROT 6.7  ALBUMIN 3.2*   No results for input(s): LIPASE, AMYLASE in the last 168 hours. No results for input(s): AMMONIA in the last 168 hours.  Coagulation profile No results for input(s): INR, PROTIME in the last 168 hours.  CBC: Recent Labs  Lab 11/30/19 1740 11/30/19 2127  12/01/19 0552 12/01/19 0850 12/02/19 0231 12/03/19 0207 12/04/19 0516  WBC 15.2* 12.7*  --  13.3*  --  19.2* 11.8* 13.2*  NEUTROABS 13.7*  --   --   --   --   --   --   --   HGB 2.5* 2.6*  --  6.8*  DUPLICATE REQUEST 6.9* 8.7* 7.5* 7.5*  HCT 9.1* 9.3*   < > [id number]*  DUPLICATE REQUEST [id number]* 28.0* 24.1* 25.4*  MCV 63.6* 65.0*  --  76.7*  --  80.9 80.1 83.3  PLT 432* 381  --  338  --  292 229 246   < > = values in this interval not displayed.   Cardiac Enzymes: No results for input(s): CKTOTAL, CKMB, CKMBINDEX, TROPONINI in the last 168 hours. BNP: Invalid input(s): POCBNP CBG: No results for input(s): GLUCAP in the last 168 hours. D-Dimer No results for input(s): DDIMER in the last 72 hours. Hgb A1c No results for input(s): HGBA1C in the last 72 hours. Lipid Profile No results for input(s): CHOL, HDL, LDLCALC, TRIG, CHOLHDL, LDLDIRECT in the last 72 hours. Thyroid function studies No results for input(s): TSH, T4TOTAL, T3FREE, THYROIDAB in the last 72 hours.  Invalid input(s): FREET3 Anemia work up No results for input(s): VITAMINB12,  FOLATE, FERRITIN, TIBC, IRON, RETICCTPCT in the last 72 hours. Microbiology Recent Results (from the past 240 hour(s))  SARS CORONAVIRUS 2 (TAT 6-24 HRS) Nasopharyngeal Nasopharyngeal Swab     Status: None   Collection Time: 11/30/19  6:54 PM   Specimen: Nasopharyngeal Swab  Result Value Ref Range Status   SARS Coronavirus 2 NEGATIVE NEGATIVE Final    Comment: (NOTE) SARS-CoV-2 target nucleic acids are NOT DETECTED. The SARS-CoV-2 RNA is generally detectable in upper and lower respiratory specimens during the acute phase of infection. Negative results do not preclude SARS-CoV-2 infection, do not rule out co-infections with other pathogens, and should not be used as the sole basis for treatment or other patient management decisions. Negative results must be combined with clinical observations, patient history, and epidemiological information. The expected result is Negative. Fact Sheet for Patients: SugarRoll.be Fact Sheet for Healthcare Providers: https://www.woods-mathews.com/ This test is not yet approved or cleared by the [place] FDA and  has been authorized for detection and/or diagnosis of SARS-CoV-2 by FDA under an Emergency Use Authorization (EUA). This EUA will remain  in effect (meaning this test can be used) for the duration of the COVID-19 declaration under Section 56 4(b)(1) of the Act, 21 U.S.C. section 360bbb-3(b)(1), unless the authorization is terminated or revoked sooner. Performed at [facility], [place] [address], [place], [place] [address]   MRSA PCR Screening     Status: None   Collection Time: 11/30/19  9:14 PM   Specimen: Nasal Mucosa; Nasopharyngeal  Result Value Ref Range Status   MRSA by PCR NEGATIVE NEGATIVE Final    Comment:        The GeneXpert MRSA Assay (FDA approved for NASAL specimens only), is one component of a comprehensive MRSA colonization surveillance program. It is not intended to  diagnose MRSA infection nor to guide or monitor treatment for MRSA infections. Performed at [facility], [place] [address], [place], [place] [address]   Urine culture     Status: Abnormal   Collection Time: 12/01/19 12:00 AM   Specimen: Urine,  Clean Catch  Result Value Ref Range Status   Specimen Description   Final    URINE, CLEAN CATCH Performed at [facility], [place] [address], [place], [place] [address]    Special Requests NONE  Final   Culture (A)  Final    <10,000 COLONIES/mL INSIGNIFICANT GROWTH Performed at [facility], [place] [address], [place], [place] [address]    Report Status 12/02/2019 FINAL  Final    Please note: You were cared for by a hospitalist during your hospital stay. Once you are discharged, your primary care physician will handle any further medical issues. Please note that NO REFILLS for any discharge medications will be authorized once you are discharged, as it is imperative that you return to your primary care physician (or establish a relationship with a primary care physician if you do not have one) for your post hospital discharge needs so that they can reassess your need for medications and monitor your lab values.  Signed: Terrilee Croak  [facility] 12/04/2019, 2:48 PM

## 2019-12-04 NOTE — Progress Notes (Signed)
Reviewed discharge paperwork with patient and her son, Kasandra Knudsen Fringer. Went over medication regimen and follow up appointments. Patient was discharged via wheelchair by NT in the care of her son.

## 2019-12-07 LAB — SURGICAL PATHOLOGY

## 2020-01-05 ENCOUNTER — Encounter: Payer: Self-pay | Admitting: Gastroenterology

## 2020-01-05 ENCOUNTER — Other Ambulatory Visit (INDEPENDENT_AMBULATORY_CARE_PROVIDER_SITE_OTHER): Payer: Medicare HMO

## 2020-01-05 ENCOUNTER — Other Ambulatory Visit: Payer: Self-pay

## 2020-01-05 ENCOUNTER — Ambulatory Visit (INDEPENDENT_AMBULATORY_CARE_PROVIDER_SITE_OTHER): Payer: Medicare HMO | Admitting: Gastroenterology

## 2020-01-05 VITALS — BP 142/80 | HR 101 | Temp 98.7°F | Ht 62.0 in | Wt 130.1 lb

## 2020-01-05 DIAGNOSIS — E538 Deficiency of other specified B group vitamins: Secondary | ICD-10-CM

## 2020-01-05 DIAGNOSIS — K269 Duodenal ulcer, unspecified as acute or chronic, without hemorrhage or perforation: Secondary | ICD-10-CM

## 2020-01-05 DIAGNOSIS — B0089 Other herpesviral infection: Secondary | ICD-10-CM

## 2020-01-05 DIAGNOSIS — B9681 Helicobacter pylori [H. pylori] as the cause of diseases classified elsewhere: Secondary | ICD-10-CM

## 2020-01-05 DIAGNOSIS — K208 Other esophagitis without bleeding: Secondary | ICD-10-CM | POA: Diagnosis not present

## 2020-01-05 DIAGNOSIS — Z86010 Personal history of colonic polyps: Secondary | ICD-10-CM

## 2020-01-05 DIAGNOSIS — K2970 Gastritis, unspecified, without bleeding: Secondary | ICD-10-CM | POA: Diagnosis not present

## 2020-01-05 DIAGNOSIS — D509 Iron deficiency anemia, unspecified: Secondary | ICD-10-CM | POA: Diagnosis not present

## 2020-01-05 DIAGNOSIS — A0839 Other viral enteritis: Secondary | ICD-10-CM

## 2020-01-05 DIAGNOSIS — D126 Benign neoplasm of colon, unspecified: Secondary | ICD-10-CM

## 2020-01-05 DIAGNOSIS — K2980 Duodenitis without bleeding: Secondary | ICD-10-CM

## 2020-01-05 DIAGNOSIS — E568 Deficiency of other vitamins: Secondary | ICD-10-CM | POA: Diagnosis not present

## 2020-01-05 DIAGNOSIS — Z1159 Encounter for screening for other viral diseases: Secondary | ICD-10-CM

## 2020-01-05 LAB — CBC WITH DIFFERENTIAL/PLATELET
Basophils Absolute: 0 10*3/uL (ref 0.0–0.1)
Basophils Relative: 0.5 % (ref 0.0–3.0)
Eosinophils Absolute: 0 10*3/uL (ref 0.0–0.7)
Eosinophils Relative: 0 % (ref 0.0–5.0)
HCT: 33.6 % — ABNORMAL LOW (ref 36.0–46.0)
Hemoglobin: 11 g/dL — ABNORMAL LOW (ref 12.0–15.0)
Lymphocytes Relative: 6.7 % — ABNORMAL LOW (ref 12.0–46.0)
Lymphs Abs: 0.5 10*3/uL — ABNORMAL LOW (ref 0.7–4.0)
MCHC: 32.6 g/dL (ref 30.0–36.0)
MCV: 89.5 fl (ref 78.0–100.0)
Monocytes Absolute: 0.5 10*3/uL (ref 0.1–1.0)
Monocytes Relative: 6.9 % (ref 3.0–12.0)
Neutro Abs: 6.5 10*3/uL (ref 1.4–7.7)
Neutrophils Relative %: 85.9 % — ABNORMAL HIGH (ref 43.0–77.0)
Platelets: 241 10*3/uL (ref 150.0–400.0)
RBC: 3.76 Mil/uL — ABNORMAL LOW (ref 3.87–5.11)
RDW: 24.7 % — ABNORMAL HIGH (ref 11.5–15.5)
WBC: 7.5 10*3/uL (ref 4.0–10.5)

## 2020-01-05 MED ORDER — FERROUS SULFATE 325 (65 FE) MG PO TBEC: 325.0000 mg | DELAYED_RELEASE_TABLET | Freq: Two times a day (BID) | ORAL | 3 refills | Status: DC

## 2020-01-05 MED ORDER — PANTOPRAZOLE SODIUM 40 MG PO TBEC: 40.0000 mg | DELAYED_RELEASE_TABLET | Freq: Two times a day (BID) | ORAL | 3 refills | Status: DC

## 2020-01-05 MED ORDER — FOLIC ACID 1 MG PO TABS: 1.0000 mg | ORAL_TABLET | Freq: Every day | ORAL | 3 refills | Status: DC

## 2020-01-05 NOTE — Patient Instructions (Signed)
Your provider has requested that you go to the basement level for lab work at [facility] in [place] [address]. Press "B" on the elevator. The lab is located at the first door on the left as you exit the elevator.  You have been scheduled for an endoscopy. Please follow written instructions given to you at your visit today. If you use inhalers (even only as needed), please bring them with you on the day of your procedure. Your physician has requested that you go to www.startemmi.com and enter the access code given to you at your visit today. This web site gives a general overview about your procedure. However, you should still follow specific instructions given to you by our office regarding your preparation for the procedure.  We have sent the following medications to your pharmacy for you to pick up at your convenience:  [place] [facility].[address] [place], [place] [address]. Main Line: [phone]. Fax: [phone].  It was a pleasure to see you today!  Vito Cirigliano, D.O.

## 2020-01-05 NOTE — Progress Notes (Signed)
P  Chief Complaint:    IDA, HSV esophagitis, H pylori  GI History: 79 year old female admitted December 1-5, 2020 for weakness, symptomatic severe anemia with admission hemoglobin 2.5, MCV in the 60s.  Ferritin 4, iron 7, TIBC 414, iron sat 2%.  Folate 5.4.  B12 normal.  Transfused 3 U PRBCs and IV iron.  Was without preceding overt GI blood loss.  Evaluation as below:  -EGD (12/02/2019, Dr. Bryan Lemma): HSV Esophagitis without bleeding, LA Grade B esophagitis, H. pylori gastritis with intestinal metaplasia, nonbleeding duodenal ulcer without high-grade stigmata, peptic duodenitis -Colonoscopy (12/02/2019, Dr. Bryan Lemma): 4 tubular adenomas and sigmoid, 1 HP and one inflammatory polyp, internal hemorrhoids  Treated with high-dose PPI, acyclovir 400 mg TID x7 days, quadruple therapy for H. Pylori x14 days, with recommendation to repeat EGD in 8 weeks to confirm mucosal healing of ulcers and confirm eradication of H. pylori.  Discharged with ferrous sulfate and folic acid.  Hemoglobin 7.5 at the time of discharge.  HPI:    Patient is a 79 y.o. female presenting to the Gastroenterology Clinic for follow-up.  Was recently hospitalized in early December for severe symptomatic anemia as outlined above.  Discharged with hemoglobin 7.5, and has continued to take ferrous sulfate and folic acid as prescribed.  States she never received Protonix refill, so has not been on any PPI for the last 2 weeks or so.  Completed acyclovir (HSV esophagitis) and antibiotics (H. pylori gastritis) as prescribed. Dropped ~10 metronidazole tabs on the floor, so actually didn't take the last 4 days of it (was given 14 day Rx).   No repeat labs since hospital discharge.  No recent abdominal imaging for review.  Ran out of folic acid and ferrous sulfate yesterday- refilling today.   Today, she states she feels much better. Presents with her son to assist in history.  Tolerating all p.o. intake.  No overt GI blood loss.   Stools are dark since starting ferrous sulfate, but not tarry.  Otherwise, no issues today.  Weight stable.   Review of systems:     No chest pain, no SOB, no fevers, no urinary sx   Past Medical History:  Diagnosis Date  . GERD (gastroesophageal reflux disease)     Patient's surgical history, family medical history, social history, medications and allergies were all reviewed in Epic    Current Outpatient Medications  Medication Sig Dispense Refill  . ferrous sulfate 325 (65 FE) MG tablet Take 1 tablet (325 mg total) by mouth 2 (two) times daily with a meal. 60 tablet 0  . folic acid (FOLVITE) 1 MG tablet Take 1 mg by mouth daily.    Marland Kitchen ibuprofen (ADVIL) 200 MG tablet Take 200 mg by mouth every 6 (six) hours as needed for fever or moderate pain.    . pantoprazole (PROTONIX) 40 MG tablet Take 1 tablet (40 mg total) by mouth 2 (two) times daily. (Patient not taking: Reported on 01/05/2020) 120 tablet 0   No current facility-administered medications for this visit.    Physical Exam:     BP (!) 142/80   Pulse (!) 101   Temp 98.7 F (37.1 C)   Ht 5\' 2"  (1.575 m)   Wt 130 lb 2 oz (59 kg)   BMI 23.80 kg/m   GENERAL:  Pleasant female in NAD PSYCH: : Cooperative, normal affect EENT:  conjunctiva pink, mucous membranes moist, neck supple without masses CARDIAC:  RRR, no murmur heard, no peripheral edema PULM: Normal respiratory effort,  lungs CTA bilaterally, no wheezing ABDOMEN:  Nondistended, soft, nontender. No obvious masses, no hepatomegaly,  normal bowel sounds SKIN:  turgor, no lesions seen Musculoskeletal:  Normal muscle tone, normal strength NEURO: Alert and oriented x 3, no focal neurologic deficits   IMPRESSION and PLAN:    1) Iron Deficiency Anemia 2) folate deficiency 3) HSV esophagitis 4) H. pylori gastritis 5) Duodenal ulcer/Peptic duodenitis  - Refilled Protonix 40 mg PO BID, Folic acid, and ferrous sulfate - EGD in Feb to evaluate for appropriate mucosal  healing of ulcers, confirm H. pylori eradication - Repeat CBC to confirm appropriate uptrend.  Was discharged at 7.5. -Placed referral to establish care with a PCM  6) Colon polyps -Colonoscopy with at least 3 subcentimeter tubular adenomas.  By current guidelines, could consider repeat colonoscopy in 2023 for ongoing polyp surveillance.  Will plan on further discussion closer to that date with surveillance recommendation based on age at that time (81), comorbidities, patient preference  The indications, risks, and benefits of EGD were explained to the patient in detail. Risks include but are not limited to bleeding, perforation, adverse reaction to medications, and cardiopulmonary compromise. Sequelae include but are not limited to the possibility of surgery, hositalization, and mortality. The patient verbalized understanding and wished to proceed. All questions answered, referred to scheduler. Further recommendations pending results of the exam.             Dominic Pea Reta Norgren ,DO, FACG 01/05/2020, 11:07 AM

## 2020-01-10 ENCOUNTER — Other Ambulatory Visit: Payer: Self-pay | Admitting: Gastroenterology

## 2020-01-10 DIAGNOSIS — E538 Deficiency of other specified B group vitamins: Secondary | ICD-10-CM

## 2020-01-10 DIAGNOSIS — D509 Iron deficiency anemia, unspecified: Secondary | ICD-10-CM

## 2020-02-11 ENCOUNTER — Other Ambulatory Visit: Payer: Self-pay | Admitting: Gastroenterology

## 2020-02-11 ENCOUNTER — Ambulatory Visit (INDEPENDENT_AMBULATORY_CARE_PROVIDER_SITE_OTHER): Payer: Medicare HMO

## 2020-02-11 DIAGNOSIS — Z1159 Encounter for screening for other viral diseases: Secondary | ICD-10-CM

## 2020-02-12 LAB — SARS CORONAVIRUS 2 (TAT 6-24 HRS): SARS Coronavirus 2: NEGATIVE

## 2020-02-15 ENCOUNTER — Other Ambulatory Visit: Payer: Self-pay

## 2020-02-15 ENCOUNTER — Ambulatory Visit (AMBULATORY_SURGERY_CENTER): Payer: Medicare HMO | Admitting: Gastroenterology

## 2020-02-15 ENCOUNTER — Encounter: Payer: Self-pay | Admitting: Gastroenterology

## 2020-02-15 VITALS — BP 126/71 | HR 75 | Temp 97.3°F | Resp 15 | Ht 62.0 in | Wt 130.0 lb

## 2020-02-15 DIAGNOSIS — D509 Iron deficiency anemia, unspecified: Secondary | ICD-10-CM | POA: Diagnosis not present

## 2020-02-15 DIAGNOSIS — K208 Other esophagitis without bleeding: Secondary | ICD-10-CM

## 2020-02-15 DIAGNOSIS — K269 Duodenal ulcer, unspecified as acute or chronic, without hemorrhage or perforation: Secondary | ICD-10-CM

## 2020-02-15 DIAGNOSIS — K2950 Unspecified chronic gastritis without bleeding: Secondary | ICD-10-CM | POA: Diagnosis not present

## 2020-02-15 DIAGNOSIS — E538 Deficiency of other specified B group vitamins: Secondary | ICD-10-CM

## 2020-02-15 DIAGNOSIS — B0089 Other herpesviral infection: Secondary | ICD-10-CM

## 2020-02-15 DIAGNOSIS — K2970 Gastritis, unspecified, without bleeding: Secondary | ICD-10-CM | POA: Diagnosis not present

## 2020-02-15 DIAGNOSIS — B9681 Helicobacter pylori [H. pylori] as the cause of diseases classified elsewhere: Secondary | ICD-10-CM

## 2020-02-15 MED ORDER — SODIUM CHLORIDE 0.9 % IV SOLN: 500.0000 mL | Freq: Once | INTRAVENOUS | Status: DC

## 2020-02-15 NOTE — Progress Notes (Signed)
[facility]

## 2020-02-15 NOTE — Patient Instructions (Addendum)
YOU HAD AN ENDOSCOPIC PROCEDURE TODAY AT [facility]:   Refer to the procedure report that was given to you for any specific questions about what was found during the examination.  If the procedure report does not answer your questions, please call your gastroenterologist to clarify.  If you requested that your care partner not be given the details of your procedure findings, then the procedure report has been included in a sealed envelope for you to review at your convenience later.  YOU SHOULD EXPECT: Some feelings of bloating in the abdomen. Passage of more gas than usual.  Walking can help get rid of the air that was put into your GI tract during the procedure and reduce the bloating. If you had a lower endoscopy (such as a colonoscopy or flexible sigmoidoscopy) you may notice spotting of blood in your stool or on the toilet paper. If you underwent a bowel prep for your procedure, you may not have a normal bowel movement for a few days.  Please Note:  You might notice some irritation and congestion in your nose or some drainage.  This is from the oxygen used during your procedure.  There is no need for concern and it should clear up in a day or so.  SYMPTOMS TO REPORT IMMEDIATELY:   Following lower endoscopy (colonoscopy or flexible sigmoidoscopy):  Excessive amounts of blood in the stool  Significant tenderness or worsening of abdominal pains  Swelling of the abdomen that is new, acute  Fever of 100F or higher   Following upper endoscopy (EGD)  Vomiting of blood or coffee ground material  New chest pain or pain under the shoulder blades  Painful or persistently difficult swallowing  New shortness of breath  Fever of 100F or higher  Black, tarry-looking stools  For urgent or emergent issues, a gastroenterologist can be reached at any hour by calling [phone].   DIET:  We do recommend a small meal at first, but then you may proceed to your regular diet.  Drink  plenty of fluids but you should avoid alcoholic beverages for 24 hours.  ACTIVITY:  You should plan to take it easy for the rest of today and you should NOT DRIVE or use heavy machinery until tomorrow (because of the sedation medicines used during the test).    FOLLOW UP: Our staff will call the number listed on your records 48-72 hours following your procedure to check on you and address any questions or concerns that you may have regarding the information given to you following your procedure. If we do not reach you, we will leave a message.  We will attempt to reach you two times.  During this call, we will ask if you have developed any symptoms of COVID 19. If you develop any symptoms (ie: fever, flu-like symptoms, shortness of breath, cough etc.) before then, please call [phone].  If you test positive for Covid 19 in the 2 weeks post procedure, please call and report this information to [place].    If any biopsies were taken you will be contacted by phone or by letter within the next 1-3 weeks.  Please call us at [phone] if you have not heard about the biopsies in 3 weeks.    SIGNATURES/CONFIDENTIALITY: You and/or your care partner have signed paperwork which will be entered into your electronic medical record.  These signatures attest to the fact that that the information above on your After Visit Summary has been reviewed and is  understood.  Full responsibility of the confidentiality of this discharge information lies with you and/or your care-partner.  Discharge instructions given. Handout on Gastritis. Resume previous medications. Biopsies taken. YOU HAD AN ENDOSCOPIC PROCEDURE TODAY AT [facility]:   Refer to the procedure report that was given to you for any specific questions about what was found during the examination.  If the procedure report does not answer your questions, please call your gastroenterologist to clarify.  If you requested that your care partner  not be given the details of your procedure findings, then the procedure report has been included in a sealed envelope for you to review at your convenience later.  YOU SHOULD EXPECT: Some feelings of bloating in the abdomen. Passage of more gas than usual.  Walking can help get rid of the air that was put into your GI tract during the procedure and reduce the bloating. If you had a lower endoscopy (such as a colonoscopy or flexible sigmoidoscopy) you may notice spotting of blood in your stool or on the toilet paper. If you underwent a bowel prep for your procedure, you may not have a normal bowel movement for a few days.  Please Note:  You might notice some irritation and congestion in your nose or some drainage.  This is from the oxygen used during your procedure.  There is no need for concern and it should clear up in a day or so.  SYMPTOMS TO REPORT IMMEDIATELY:    Following upper endoscopy (EGD)  Vomiting of blood or coffee ground material  New chest pain or pain under the shoulder blades  Painful or persistently difficult swallowing  New shortness of breath  Fever of 100F or higher  Black, tarry-looking stools  For urgent or emergent issues, a gastroenterologist can be reached at any hour by calling [phone].   DIET:  We do recommend a small meal at first, but then you may proceed to your regular diet.  Drink plenty of fluids but you should avoid alcoholic beverages for 24 hours.  ACTIVITY:  You should plan to take it easy for the rest of today and you should NOT DRIVE or use heavy machinery until tomorrow (because of the sedation medicines used during the test).    FOLLOW UP: Our staff will call the number listed on your records 48-72 hours following your procedure to check on you and address any questions or concerns that you may have regarding the information given to you following your procedure. If we do not reach you, we will leave a message.  We will attempt to reach you  two times.  During this call, we will ask if you have developed any symptoms of COVID 19. If you develop any symptoms (ie: fever, flu-like symptoms, shortness of breath, cough etc.) before then, please call [phone].  If you test positive for Covid 19 in the 2 weeks post procedure, please call and report this information to [place].    If any biopsies were taken you will be contacted by phone or by letter within the next 1-3 weeks.  Please call us at [phone] if you have not heard about the biopsies in 3 weeks.    SIGNATURES/CONFIDENTIALITY: You and/or your care partner have signed paperwork which will be entered into your electronic medical record.  These signatures attest to the fact that that the information above on your After Visit Summary has been reviewed and is understood.  Full responsibility of the confidentiality of this  discharge information lies with you and/or your care-partner. 

## 2020-02-15 NOTE — Op Note (Signed)
[facility] Patient Name: Danielle Adkins Procedure Date: 02/15/2020 11:37 AM MRN: [id number] Endoscopist: Gerrit Heck , MD Age: 79 Referring MD:  Date of Birth: Apr 30, 1941 Gender: Female Account #: [id number] Procedure:                Upper GI endoscopy Indications:              Iron deficiency anemia, Follow-up of Helicobacter                            pylori, Follow-up of duodenal ulcer with                            hemorrhage, Follow-up of HSV esophagitis                           79 year old female admitted December 1-5, 2020 for                            weakness, symptomatic severe anemia with admission                            hemoglobin 2.5, MCV in the 60s. Ferritin 4, iron 7,                            TIBC 414, iron sat 2%. Folate 5.4. B12 normal.                            Transfused 3 U PRBCs and IV iron. Was without                            preceding overt GI blood loss. 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 pylori gastritis with                            intestinal metaplasia, nonbleeding duodenal ulcer                            without high-grade stigmata, peptic duodenitis                           ?"Colonoscopy (12/02/2019): 4 tubular adenomas and                            sigmoid, 1 HP and one inflammatory polyp, internal                            hemorrhoids                           Treated  with high-dose PPI, acyclovir 400 mg TID x7                            days, quadruple therapy for H. Pylori x14 days. Medicines:                Monitored Anesthesia Care Procedure:                Pre-Anesthesia Assessment:                           - Prior to the procedure, a History and Physical                            was performed, and patient medications and                            allergies were reviewed. The patient's tolerance  of                            previous anesthesia was also reviewed. The risks                            and benefits of the procedure and the sedation                            options and risks were discussed with the patient.                            All questions were answered, and informed consent                            was obtained. Prior Anticoagulants: The patient has                            taken no previous anticoagulant or antiplatelet                            agents. ASA Grade Assessment: II - A patient with                            mild systemic disease. After reviewing the risks                            and benefits, the patient was deemed in                            satisfactory condition to undergo the procedure.                           After obtaining informed consent, the endoscope was                            passed under direct vision. Throughout the  procedure, the patient's blood pressure, pulse, and                            oxygen saturations were monitored continuously. The                            Endoscope was introduced through the mouth, and                            advanced to the second part of duodenum. The upper                            GI endoscopy was accomplished without difficulty.                            The patient tolerated the procedure well. Scope In: Scope Out: Findings:                 The upper third of the esophagus, middle third of                            the esophagus and lower third of the esophagus were                            normal.                           The gastroesophageal flap valve was visualized                            endoscopically and classified as Hill Grade IV (no                            fold, wide open lumen, hiatal hernia present).                           Scattered mild inflammation characterized by                            erythema was found in  the gastric body and in the                            gastric antrum. Biopsies were taken with a cold                            forceps for Helicobacter pylori testing. Estimated                            blood loss was minimal.                           The duodenal bulb was normal. Biopsies for                            histology  were taken with a cold forceps for                            evaluation of celiac disease. Estimated blood loss                            was minimal.                           Dilated lacteals were found in the second portion                            of the duodenum. Biopsies were taken with a cold                            forceps for histology. Estimated blood loss was                            minimal. Complications:            No immediate complications. Estimated Blood Loss:     Estimated blood loss was minimal. Impression:               - Normal upper third of esophagus, middle third of                            esophagus and lower third of esophagus.                           - Gastroesophageal flap valve classified as Hill                            Grade IV (no fold, wide open lumen, hiatal hernia                            present).                           - Gastritis. Biopsied.                           - Normal duodenal bulb. Biopsied.                           - Dilated lacteals were found in the duodenum.                            Biopsied.                           - Previously noted duodenal ulcer/peptic duodenitis                            and esophagitis have since healed. Recommendation:           - Patient has a contact number available for  emergencies. The signs and symptoms of potential                            delayed complications were discussed with the                            patient. Return to normal activities tomorrow.                            Written discharge instructions were  provided to the                            patient.                           - Resume previous diet.                           - Continue present medications.                           - Await pathology results.                           - Return to GI clinic in 2 months or sooner as                            needed.                           - Please obtain labs as previously ordered (CBC,                            iron panel) Gerrit Heck, MD 02/15/2020 12:12:43 PM

## 2020-02-15 NOTE — Progress Notes (Signed)
To PACU, VSS. Report to [email or web address] 

## 2020-02-15 NOTE — Progress Notes (Signed)
Called to room to assist during endoscopic procedure.  Patient ID and intended procedure confirmed with present staff. Received instructions for my participation in the procedure from the performing physician.  

## 2020-02-18 ENCOUNTER — Telehealth: Payer: Self-pay | Admitting: *Deleted

## 2020-02-18 NOTE — Telephone Encounter (Signed)
  Follow up Call-  Call back number 02/15/2020  Post procedure Call Back phone  # [phone]  Permission to leave phone message Yes     Patient questions:  Do you have a fever, pain , or abdominal swelling? No. Pain Score  0 *  Have you tolerated food without any problems? Yes.    Have you been able to return to your normal activities? Yes.    Do you have any questions about your discharge instructions: Diet   No. Medications  No. Follow up visit  No.  Do you have questions or concerns about your Care? No.  Actions: * If pain score is 4 or above: 1. No action needed, pain <4.Have you developed a fever since your procedure? no  2.   Have you had an respiratory symptoms (SOB or cough) since your procedure? no  3.   Have you tested positive for COVID 19 since your procedure no  4.   Have you had any family members/close contacts diagnosed with the COVID 19 since your procedure?  no   If yes to any of these questions please route to Joylene John, RN and Alphonsa Gin, Therapist, sports.

## 2020-02-21 ENCOUNTER — Encounter: Payer: Self-pay | Admitting: Gastroenterology

## 2020-03-23 ENCOUNTER — Other Ambulatory Visit (INDEPENDENT_AMBULATORY_CARE_PROVIDER_SITE_OTHER): Payer: Medicare HMO

## 2020-03-23 DIAGNOSIS — D509 Iron deficiency anemia, unspecified: Secondary | ICD-10-CM

## 2020-03-23 DIAGNOSIS — E538 Deficiency of other specified B group vitamins: Secondary | ICD-10-CM

## 2020-03-23 LAB — IBC + FERRITIN
Ferritin: 41.3 ng/mL (ref 10.0–291.0)
Iron: 114 ug/dL (ref 42–145)
Saturation Ratios: 32.4 % (ref 20.0–50.0)
Transferrin: 251 mg/dL (ref 212.0–360.0)

## 2020-03-23 LAB — CBC WITH DIFFERENTIAL/PLATELET
Basophils Absolute: 0 10*3/uL (ref 0.0–0.1)
Basophils Relative: 0.2 % (ref 0.0–3.0)
Eosinophils Absolute: 0 10*3/uL (ref 0.0–0.7)
Eosinophils Relative: 0.4 % (ref 0.0–5.0)
HCT: 35.5 % — ABNORMAL LOW (ref 36.0–46.0)
Hemoglobin: 11.9 g/dL — ABNORMAL LOW (ref 12.0–15.0)
Lymphocytes Relative: 12.1 % (ref 12.0–46.0)
Lymphs Abs: 0.8 10*3/uL (ref 0.7–4.0)
MCHC: 33.5 g/dL (ref 30.0–36.0)
MCV: 91.3 fl (ref 78.0–100.0)
Monocytes Absolute: 0.5 10*3/uL (ref 0.1–1.0)
Monocytes Relative: 7.3 % (ref 3.0–12.0)
Neutro Abs: 5.3 10*3/uL (ref 1.4–7.7)
Neutrophils Relative %: 80 % — ABNORMAL HIGH (ref 43.0–77.0)
Platelets: 199 10*3/uL (ref 150.0–400.0)
RBC: 3.89 Mil/uL (ref 3.87–5.11)
RDW: 14.2 % (ref 11.5–15.5)
WBC: 6.6 10*3/uL (ref 4.0–10.5)

## 2020-03-23 LAB — FOLATE: Folate: 20.1 ng/mL (ref >5.9)

## 2020-03-27 ENCOUNTER — Other Ambulatory Visit: Payer: Self-pay | Admitting: Gastroenterology

## 2020-03-27 DIAGNOSIS — E538 Deficiency of other specified B group vitamins: Secondary | ICD-10-CM

## 2020-03-27 DIAGNOSIS — D509 Iron deficiency anemia, unspecified: Secondary | ICD-10-CM

## 2020-04-26 ENCOUNTER — Other Ambulatory Visit: Payer: Self-pay | Admitting: Gastroenterology

## 2020-05-05 ENCOUNTER — Other Ambulatory Visit: Payer: Self-pay | Admitting: Gastroenterology

## 2020-05-30 ENCOUNTER — Other Ambulatory Visit: Payer: Self-pay | Admitting: Gastroenterology

## 2020-08-27 ENCOUNTER — Other Ambulatory Visit: Payer: Self-pay | Admitting: Gastroenterology

## 2021-01-23 ENCOUNTER — Other Ambulatory Visit: Payer: Self-pay | Admitting: Gastroenterology

## 2021-02-23 ENCOUNTER — Other Ambulatory Visit: Payer: Self-pay | Admitting: Gastroenterology

## 2022-01-21 ENCOUNTER — Other Ambulatory Visit: Payer: Self-pay

## 2022-01-21 ENCOUNTER — Telehealth: Payer: Self-pay | Admitting: Gastroenterology

## 2022-01-21 NOTE — Telephone Encounter (Signed)
Patient's son called.  Patient recently had a procedure with Dr. Bryan Lemma, and since then patient has been having the following symptoms:  no energy, drinks water OK, not really eating, mostly snacking, stools have been runny and black-looking.  Mother and brother passed from dementia/Alzheimers.  Son is very concerned stating "something just is not right."  He would like for him to see the doctor sooner rather than later.  Currently, I am showing no open appointments for either him or the apps.  Please call patient's son and advise.  Thank you.

## 2022-01-21 NOTE — Telephone Encounter (Signed)
Spoke with pt's son and pt. Pt states she is experiencing dark colored diarrhea that started 1 week ago. Pt states she has 2 bm's per day. Pt states she is not currently taking an iron supplement but has been taking pepto bismol. Pt states she sometimes feels light headed when she walks but is not lightheaded all the time. Pt denies shortness of breath but also states she has felt fatigued. Pt is scheduled for appt with Tye Savoy, NP on Wednesday 01/23/22 at 1:30 pm. Told pt's son that if pt seems to get worse before appt to take pt to ER. Also told son I would send message to doctor.

## 2022-01-21 NOTE — Telephone Encounter (Signed)
Spoke with patient's son to let him know Dr. Vivia Ewing recommendations. Pt's son verbalized understanding.

## 2022-01-21 NOTE — Telephone Encounter (Signed)
Agree with expedited appointment as outlined.  Of note, EGD was performed nearly 2 years ago, not recently.  Also strongly recommend getting an appointment with her [facility] for evaluation of lightheadedness, fatigue.

## 2022-01-22 ENCOUNTER — Inpatient Hospital Stay (HOSPITAL_COMMUNITY)
Admission: EM | Admit: 2022-01-22 | Discharge: 2022-01-25 | DRG: 377 | Disposition: A | Discharge status: Home / Self Care | Payer: Medicare HMO | Attending: Internal Medicine | Admitting: Internal Medicine

## 2022-01-22 ENCOUNTER — Other Ambulatory Visit: Payer: Self-pay

## 2022-01-22 ENCOUNTER — Inpatient Hospital Stay (HOSPITAL_COMMUNITY): Payer: Medicare HMO

## 2022-01-22 ENCOUNTER — Encounter (HOSPITAL_COMMUNITY): Payer: Self-pay

## 2022-01-22 DIAGNOSIS — K209 Esophagitis, unspecified without bleeding: Secondary | ICD-10-CM | POA: Diagnosis present

## 2022-01-22 DIAGNOSIS — Z23 Encounter for immunization: Secondary | ICD-10-CM

## 2022-01-22 DIAGNOSIS — D509 Iron deficiency anemia, unspecified: Secondary | ICD-10-CM | POA: Diagnosis not present

## 2022-01-22 DIAGNOSIS — Z79899 Other long term (current) drug therapy: Secondary | ICD-10-CM | POA: Diagnosis not present

## 2022-01-22 DIAGNOSIS — K31819 Angiodysplasia of stomach and duodenum without bleeding: Secondary | ICD-10-CM | POA: Diagnosis present

## 2022-01-22 DIAGNOSIS — K219 Gastro-esophageal reflux disease without esophagitis: Secondary | ICD-10-CM | POA: Diagnosis present

## 2022-01-22 DIAGNOSIS — K921 Melena: Principal | ICD-10-CM | POA: Diagnosis present

## 2022-01-22 DIAGNOSIS — D696 Thrombocytopenia, unspecified: Secondary | ICD-10-CM | POA: Diagnosis present

## 2022-01-22 DIAGNOSIS — U071 COVID-19: Secondary | ICD-10-CM | POA: Diagnosis not present

## 2022-01-22 DIAGNOSIS — K449 Diaphragmatic hernia without obstruction or gangrene: Secondary | ICD-10-CM | POA: Diagnosis not present

## 2022-01-22 DIAGNOSIS — D500 Iron deficiency anemia secondary to blood loss (chronic): Secondary | ICD-10-CM

## 2022-01-22 DIAGNOSIS — E876 Hypokalemia: Secondary | ICD-10-CM

## 2022-01-22 DIAGNOSIS — D61818 Other pancytopenia: Secondary | ICD-10-CM | POA: Diagnosis not present

## 2022-01-22 DIAGNOSIS — F03A0 Unspecified dementia, mild, without behavioral disturbance, psychotic disturbance, mood disturbance, and anxiety: Secondary | ICD-10-CM | POA: Diagnosis not present

## 2022-01-22 DIAGNOSIS — D649 Anemia, unspecified: Secondary | ICD-10-CM

## 2022-01-22 DIAGNOSIS — F0390 Unspecified dementia without behavioral disturbance: Secondary | ICD-10-CM | POA: Diagnosis present

## 2022-01-22 DIAGNOSIS — D62 Acute posthemorrhagic anemia: Secondary | ICD-10-CM | POA: Diagnosis not present

## 2022-01-22 HISTORY — DX: Peptic ulcer, site unspecified, unspecified as acute or chronic, without hemorrhage or perforation: K27.9

## 2022-01-22 HISTORY — DX: Other esophagitis without bleeding: K20.80

## 2022-01-22 LAB — CBC
HCT: 15.3 % — ABNORMAL LOW (ref 36.0–46.0)
Hemoglobin: 4.1 g/dL — CL (ref 12.0–15.0)
MCH: 16.9 pg — ABNORMAL LOW (ref 26.0–34.0)
MCHC: 26.8 g/dL — ABNORMAL LOW (ref 30.0–36.0)
MCV: 63 fL — ABNORMAL LOW (ref 80.0–100.0)
Platelets: 65 10*3/uL — ABNORMAL LOW (ref 150–400)
RBC: 2.43 MIL/uL — ABNORMAL LOW (ref 3.87–5.11)
RDW: 23 % — ABNORMAL HIGH (ref 11.5–15.5)
WBC: 6.8 10*3/uL (ref 4.0–10.5)
nRBC: 0.7 % — ABNORMAL HIGH (ref 0.0–0.2)

## 2022-01-22 LAB — COMPREHENSIVE METABOLIC PANEL
ALT: 13 U/L (ref 0–44)
AST: 21 U/L (ref 15–41)
Albumin: 3.7 g/dL (ref 3.5–5.0)
Alkaline Phosphatase: 63 U/L (ref 38–126)
Anion gap: 9 (ref 5–15)
BUN: 9 mg/dL (ref 8–23)
CO2: 26 mmol/L (ref 22–32)
Calcium: 8.7 mg/dL — ABNORMAL LOW (ref 8.9–10.3)
Chloride: 99 mmol/L (ref 98–111)
Creatinine, Ser: 0.52 mg/dL (ref 0.44–1.00)
GFR, Estimated: >60 mL/min (ref >60)
Glucose, Bld: 138 mg/dL — ABNORMAL HIGH (ref 70–99)
Potassium: 2.9 mmol/L — ABNORMAL LOW (ref 3.5–5.1)
Sodium: 134 mmol/L — ABNORMAL LOW (ref 135–145)
Total Bilirubin: 0.7 mg/dL (ref 0.3–1.2)
Total Protein: 7.1 g/dL (ref 6.5–8.1)

## 2022-01-22 LAB — LIPASE, BLOOD: Lipase: 61 U/L — ABNORMAL HIGH (ref 11–51)

## 2022-01-22 LAB — RESP PANEL BY RT-PCR (FLU A&B, COVID) ARPGX2
Influenza A by PCR: NEGATIVE
Influenza B by PCR: NEGATIVE
SARS Coronavirus 2 by RT PCR: POSITIVE — AB

## 2022-01-22 LAB — PREPARE RBC (CROSSMATCH)

## 2022-01-22 MED ORDER — PANTOPRAZOLE SODIUM 40 MG IV SOLR
40.0000 mg | Freq: Two times a day (BID) | INTRAVENOUS | Status: DC
  Administered 2022-01-22 – 2022-01-24 (×4): 40 mg via INTRAVENOUS
  Filled 2022-01-22 (×4): qty 40

## 2022-01-22 MED ORDER — INFLUENZA VAC A&B SA ADJ QUAD 0.5 ML IM PRSY
0.5000 mL | PREFILLED_SYRINGE | INTRAMUSCULAR | Status: AC
  Administered 2022-01-25: 0.5 mL via INTRAMUSCULAR
  Filled 2022-01-22 (×2): qty 0.5

## 2022-01-22 MED ORDER — ACETAMINOPHEN 650 MG RE SUPP: 650.0000 mg | Freq: Four times a day (QID) | RECTAL | Status: DC | PRN

## 2022-01-22 MED ORDER — ONDANSETRON HCL 4 MG PO TABS
4.0000 mg | ORAL_TABLET | Freq: Four times a day (QID) | ORAL | Status: DC | PRN
Start: 2022-01-22 — End: 2022-01-26

## 2022-01-22 MED ORDER — MAGNESIUM OXIDE -MG SUPPLEMENT 400 (240 MG) MG PO TABS
800.0000 mg | ORAL_TABLET | Freq: Once | ORAL | Status: AC
  Administered 2022-01-22: 17:00:00 800 mg via ORAL
  Filled 2022-01-22: qty 2

## 2022-01-22 MED ORDER — POTASSIUM CHLORIDE CRYS ER 20 MEQ PO TBCR
40.0000 meq | EXTENDED_RELEASE_TABLET | Freq: Once | ORAL | Status: AC
  Administered 2022-01-22: 17:00:00 40 meq via ORAL
  Filled 2022-01-22: qty 2

## 2022-01-22 MED ORDER — ACETAMINOPHEN 325 MG PO TABS: 650.0000 mg | ORAL_TABLET | Freq: Four times a day (QID) | ORAL | Status: DC | PRN

## 2022-01-22 MED ORDER — SODIUM CHLORIDE 0.9% IV SOLUTION: Freq: Once | INTRAVENOUS | Status: AC

## 2022-01-22 MED ORDER — POTASSIUM CHLORIDE CRYS ER 20 MEQ PO TBCR
40.0000 meq | EXTENDED_RELEASE_TABLET | Freq: Once | ORAL | Status: AC
  Administered 2022-01-22: 20:00:00 40 meq via ORAL
  Filled 2022-01-22: qty 2

## 2022-01-22 MED ORDER — ONDANSETRON HCL 4 MG/2ML IJ SOLN: 4.0000 mg | Freq: Four times a day (QID) | INTRAMUSCULAR | Status: DC | PRN

## 2022-01-22 NOTE — Assessment & Plan Note (Signed)
-   Check magnesium - Supplement potassium

## 2022-01-22 NOTE — Assessment & Plan Note (Addendum)
This is a new issue.  Interesting.  Would not expect this from GI bleed alone.  Denies alcohol use. - Obtain US abdomen to look at liver and spleen -Trend platelets -Check coags

## 2022-01-22 NOTE — Assessment & Plan Note (Signed)
-   Recommend outpatient follow-up

## 2022-01-22 NOTE — Hospital Course (Signed)
Danielle Adkins is a 81 y.o. F with history HSV esophagitis, H. pylori with bleeding esophageal and gastric ulcers 12/21 and iron deficiency anemia who presents with 1 week melena, lightheadedness, and fatigue.  History collected from the patient's son at the patient has mild dementia and is somewhat fuzzy with details.  Evidently she has been in good health for the last year after her endoscopy last spring showed HSV esophagitis.  Her hemoglobin got up to 12 g/dL.  She was taking iron and pantoprazole fairly regularly.  Then recently son started to notice she had runny black stools.  Then she started to complain of fatigue, lightheadedness so he brought her to the emergency room.  In the ER, hemoglobin 4.2, platelets 65, potassium 2.7, creatinine normal.  Started on PPI and admitted to hospitalists.

## 2022-01-22 NOTE — H&P (View-Only) (Signed)
Referring Provider: EDP, Dr. Matilde Sprang Primary Care Physician:  Patient, No Pcp Per (Inactive) Primary Gastroenterologist:  Dr. Bryan Lemma  Reason for Consultation:  Anemia and black stools  HPI: Danielle Adkins is a 81 y.o. female with limited past medical history, but history of iron deficiency anemia, HSV esophagitis and duodenal ulcer as well as H. pylori infection who presented to [facility] long hospital today due having black stool at home and near syncope.  The patient reports that the dark stools may have been going on for the past week or 2.  She denies any other GI complaints including nausea, vomiting, abdominal pain, dysphagia, odynophagia, red rectal bleeding, decrease in appetite.  She says her appetite is good.  She is on pantoprazole 40 mg twice daily at home.  She was previously on ferrous sulfate, but she tells me that her PCP told her to discontinue it sometime ago.  Presented with anemia with hemoglobin down to 2.4 g in December 2020 and underwent GI evaluation at that point.  Here hemoglobin is 4.1 g.  MCV is low at 63.  Platelets are low at 65.  Potassium low at 2.9.  EGD and colonoscopy 11/2019:  - Esophageal ulcers with no stigmata of recent bleeding. Biopsied. - LA Grade B esophagitis with no bleeding. Biopsied. - Gastritis. Biopsied. - Non-bleeding duodenal ulcer with no stigmata of bleeding. Biopsied. - Normal first portion of the duodenum and second portion of the duodenum. Biopsied.  - Preparation of the colon was fair. - Hemorrhoids found on perianal exam. - Six 4 to 8 mm polyps in the sigmoid colon, in the transverse colon and in the ascending colon, removed with a cold snare. Resected and retrieved. - Stool in the entire examined colon. - Non-bleeding internal hemorrhoids. - The examined portion of the ileum was normal.   A. 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 STOMACH, BIOPSY:  - Gastric antral and oxyntic mucosa with Helicobacter pylori-associated gastritis (confirmed with Warthin Starry stain)  - Intestinal metaplasia of antral mucosa, negative for dysplasia   D. ESOPHAGUS, BIOPSY:  - Esophageal squamous and cardiac mucosa showing severe acute  esophagitis with ulceration and cytopathic changes suggestive of herpes  esophagitis.  See comment  - Negative for intestinal metaplasia or dysplasia   E. COLON, ASCENDING, TRANSVERSE, SIGMOID, POLYPECTOMY:  - Tubular adenoma(s) without high-grade dysplasia or malignancy  - Inflammatory polyp  - Hyperplastic polyp   ADDENDUM:   HSVI immunohistochemistry is POSITIVE; supporting the impression of  Herpes esophagitis.  HSVII is NEGATIVE.   EGD 01/2020 by Dr. Bryan Lemma:  - Normal upper third of esophagus, middle third of esophagus and lower third of esophagus. - Gastroesophageal flap valve classified as Hill Grade IV (no fold, wide open lumen, hiatal hernia present). - Gastritis. Biopsied. - Normal duodenal bulb. Biopsied. - Dilated lacteals were found in the duodenum. Biopsied. - Previously noted duodenal ulcer/peptic duodenitis and esophagitis have since healed.  1. Surgical [P], duodenal bulb, 2nd portion and duodenum and distal duodenum - DUODENAL MUCOSA WITH NO SIGNIFICANT PATHOLOGIC FINDINGS. - NEGATIVE FOR INCREASED INTRAEPITHELIAL LYMPHOCYTES AND VILLOUS ARCHITECTURAL CHANGES. 2. Surgical [P], gastric antrum and gastric body - GASTRIC ANTRAL AND OXYNTIC MUCOSA WITH MILD CHRONIC GASTRITIS AND REACTIVE CHANGES. - WARTHIN-STARRY STAIN IS NEGATIVE FOR HELICOBACTER PYLORI.   Past Medical History:  Diagnosis  Date   Anemia    IDA   Arthritis    Blood transfusion without reported diagnosis    GERD (gastroesophageal reflux disease)     Past Surgical History:  Procedure  Laterality Date   BIOPSY  12/02/2019   Procedure: BIOPSY;  Surgeon: Lavena Bullion, DO;  Location: [facility];  Service: Gastroenterology;;   COLONOSCOPY WITH PROPOFOL N/A 12/02/2019   Procedure: COLONOSCOPY WITH PROPOFOL;  Surgeon: Lavena Bullion, DO;  Location: [facility];  Service: Gastroenterology;  Laterality: N/A;   ESOPHAGOGASTRODUODENOSCOPY (EGD) WITH PROPOFOL N/A 12/02/2019   Procedure: ESOPHAGOGASTRODUODENOSCOPY (EGD) WITH PROPOFOL;  Surgeon: Lavena Bullion, DO;  Location: [facility];  Service: Gastroenterology;  Laterality: N/A;   POLYPECTOMY  12/02/2019   Procedure: POLYPECTOMY;  Surgeon: Lavena Bullion, DO;  Location: [facility];  Service: Gastroenterology;;    Prior to Admission medications   Medication Sig Start Date End Date Taking? Authorizing Provider  ferrous sulfate 325 (65 FE) MG EC tablet Take 1 tablet (325 mg total) by mouth 2 (two) times daily. 01/05/20   Cirigliano, Vito V, DO  folic acid (FOLVITE) 1 MG tablet Take 1 tablet (1 mg total) by mouth daily. 01/05/20   Cirigliano, Vito V, DO  ibuprofen (ADVIL) 200 MG tablet Take 200 mg by mouth every 6 (six) hours as needed for fever or moderate pain.    [provider]  pantoprazole (PROTONIX) 40 MG tablet TAKE 1 TABLET BY MOUTH TWICE A DAY 08/28/20   Cirigliano, Vito V, DO    Current Facility-Administered Medications  Medication Dose Route Frequency Provider Last Rate Last Admin   0.9 %  sodium chloride infusion (Manually program via Guardrails IV Fluids)   Intravenous Once Kommor, Madison, MD       magnesium oxide (MAG-OX) tablet 800 mg  800 mg Oral Once Kommor, Madison, MD       potassium chloride SA (KLOR-CON M) CR tablet 40 mEq  40 mEq Oral Once Kommor, Madison, MD       Current Outpatient Medications  Medication Sig Dispense Refill   ferrous sulfate 325 (65 FE) MG EC tablet Take 1 tablet (325 mg total) by mouth 2 (two) times daily. 60 tablet 3   folic acid (FOLVITE) 1 MG tablet Take 1  tablet (1 mg total) by mouth daily. 30 tablet 3   ibuprofen (ADVIL) 200 MG tablet Take 200 mg by mouth every 6 (six) hours as needed for fever or moderate pain.     pantoprazole (PROTONIX) 40 MG tablet TAKE 1 TABLET BY MOUTH TWICE A DAY 180 tablet 1    Allergies as of 01/22/2022   (No Known Allergies)    Family History  Problem Relation Age of Onset   Colon cancer Neg Hx    Esophageal cancer Neg Hx    Rectal cancer Neg Hx    Stomach cancer Neg Hx     Social History   Socioeconomic History   Marital status: Widowed    Spouse name: Not on file   Number of children: Not on file   Years of education: Not on file   Highest education level: Not on file  Occupational History   Not on file  Tobacco Use   Smoking status: Never   Smokeless tobacco: Never  Vaping Use   Vaping Use: Never used  Substance and Sexual Activity   Alcohol use: Not Currently   Drug use: Never   Sexual activity: Not on file  Other Topics Concern   Not  on file  Social History Narrative   Not on file   Social Determinants of Health   Financial Resource Strain: Not on file  Food Insecurity: Not on file  Transportation Needs: Not on file  Physical Activity: Not on file  Stress: Not on file  Social Connections: Not on file  Intimate Partner Violence: Not on file    Review of Systems: ROS is O/W negative except as mentioned in HPI.  Physical Exam: Vital signs in last 24 hours: Temp:  [98.5 F (36.9 C)] 98.5 F (36.9 C) (01/24 1341) Pulse Rate:  [67-92] 67 (01/24 1545) Resp:  [16-18] 16 (01/24 1545) BP: (117-123)/(55-71) 117/55 (01/24 1545) SpO2:  [98 %-100 %] 100 % (01/24 1545) Weight:  [59 kg] 59 kg (01/24 1345)   General:  Alert, Well-developed, well-nourished, pleasant and cooperative in NAD Head:  Normocephalic and atraumatic. Eyes:  Sclera clear, no icterus.  Conjunctiva pale. Ears:  Normal auditory acuity. Mouth:  No deformity or lesions.   Lungs:  Clear throughout to auscultation.   No wheezes, crackles, or rhonchi.  Heart:  Regular rate and rhythm; no murmurs, clicks, rubs, or gallops. Abdomen:  Soft, non-distended.  BS present.  Non-tender.   Msk:  Symmetrical without gross deformities. Pulses:  Normal pulses noted. Extremities:  Without clubbing or edema. Neurologic:  Alert and oriented x 4;  grossly normal neurologically. Skin:  Intact without significant lesions or rashes. Psych:  Alert and cooperative. Normal mood and affect.  Lab Results: Recent Labs    01/22/22 1527  WBC 6.8  HGB 4.1*  HCT 15.3*  PLT 65*   BMET Recent Labs    01/22/22 1527  NA 134*  K 2.9*  CL 99  CO2 26  GLUCOSE 138*  BUN 9  CREATININE 0.52  CALCIUM 8.7*   LFT Recent Labs    01/22/22 1527  PROT 7.1  ALBUMIN 3.7  AST 21  ALT 13  ALKPHOS 63  BILITOT 0.7   IMPRESSION:  *81 year old female with history of iron deficiency anemia:  Hgb 11.9 grams 02/2020.  Is at 4.1 grams here today.  Was down to 2.5 grams in 11/2019.  She was previously on ferrous sulfate, but tells me that quite some time ago her PCP told her to stop it.  I am assuming this because her blood counts had improved.  Having dark stools for the past 1-2 weeks.  MCV is low at 63. *Thrombocytopenia: Platelets 65.  Likely due to GI bleeding. *History of HSV esophagitis and DU on EGD in 11/2019.  Has been on pantoprazole 40 mg p.o. twice daily at home. *Hypokalemia:  K+ 2.9  PLAN: -Transfuse packed red blood cells. -Trend labs. -Pantoprazole 40 mg IV BID. -Possible EGD on 1/25 so we will make her n.p.o. after midnight. -Primary service will replete potassium. -Will check iron studies.   Laban Emperor. Zehr  01/22/2022, 4:30 PM   _______________________________________________________________________________________________________________________________________  Velora Heckler GI MD note:  I personally examined the patient, reviewed the data and agree with the assessment and plan described above.  She has had  dark stools for about a week without any other GI symptoms. She rarely takes NSAIDs but did take 2 ASA in the past week.  She is Covid + but has had no fevers, chills, cough or SOB. She received 2 units of blood and Hb up from 4.1 to 7 this AM. Obviously she's been having some GI bleeding, possibly from UGI tract.  I am concerned that her plts  were 65 yesterday and are in 40s this morning.  2 years ago when she presented with ulcer related bleeding and Hb in the 2s her Plts were never low.  I wonder if she has a hematologic issue as the primary problem here.  Before going ahead with invasive GI testing I think she needs a heme workup.  Should probably transfuse another 1 unit PRBC and ? Tranfuse platelets as well. I will message the hospitalist team about this.  For now we are holding on EGD and so I will let her eat. Continue IV PPI BID.   Owens Loffler, MD [facility] Pager [phone]

## 2022-01-22 NOTE — ED Triage Notes (Signed)
Per EMS- patient c/o watery stools and family reports that the stools have been black and thinks that they saw blood today. EMS reports that the stool they saw was green and watery.  Patient has a GI appointment tomorrow, but family wanted her seen today.

## 2022-01-22 NOTE — Assessment & Plan Note (Signed)
History of herpes esophagitis last year, confirmed with immunostaining.  Status post acyclovir. - Consult GI - Check HIV RPR

## 2022-01-22 NOTE — ED Provider Notes (Signed)
[facility] DEPT Provider Note  CSN: [id number] Arrival date & time: 01/22/22 1334  Chief Complaint(s) Diarrhea and Weakness  HPI Danielle Adkins is a 81 y.o. female with PMH iron deficiency anemia previously requiring blood transfusions, HSV esophagitis, H. pylori gastritis who presents emergency department for evaluation of diarrhea, dark stools and presyncope.  History obtained from patient and patient's son who states that the patient has had multiple intermittent episodes of presyncope primarily after using the toilet.  He has noticed darkening in the color of her stools.  Patient is scheduled for a GI evaluation tomorrow but was brought to the emergency department today due to familial concern.  Chart review reveals that the patient's hemoglobin has been as low as 2.5 in the past.  She currently denies chest pain, shortness of breath, abdominal pain or nausea, vomiting or other systemic symptoms.   Diarrhea Weakness Associated symptoms: diarrhea    Past Medical History Past Medical History:  Diagnosis Date   Anemia    IDA   Arthritis    Blood transfusion without reported diagnosis    GERD (gastroesophageal reflux disease)    H pylori ulcer    Herpes simplex esophagitis    PUD (peptic ulcer disease)    Patient Active Problem List   Diagnosis Date Noted   Acute blood loss anemia 01/22/2022   Melena 01/22/2022   Dementia without behavioral disturbance ([place]) 01/22/2022   Hypokalemia 01/22/2022   Thrombocytopenia ([place]) 01/22/2022   Esophagitis 01/22/2022   Grade III hemorrhoids    Iron deficiency anemia    Home Medication(s) Prior to Admission medications   Medication Sig Start Date End Date Taking? Authorizing Provider  Dextromethorphan-Benzocaine (COUGH/SORE THROAT LOZENGES MT) Use as directed 1 lozenge in the mouth or throat as needed (for a sore throat/irritation).   Yes [provider]  pantoprazole (PROTONIX) 40 MG tablet TAKE 1  TABLET BY MOUTH TWICE A DAY Patient taking differently: Take 40 mg by mouth 2 (two) times daily before a meal. 08/28/20  Yes Cirigliano, [place], DO  [place] 10-19-19 MG PACK Take 1 packet by mouth every 6 (six) hours as needed (for cold-like symptoms/mix and drink).   Yes [provider]  ferrous sulfate 325 (65 FE) MG EC tablet Take 1 tablet (325 mg total) by mouth 2 (two) times daily. Patient not taking: Reported on 01/22/2022 01/05/20   Cirigliano, Dominic Pea, DO  folic acid (FOLVITE) 1 MG tablet Take 1 tablet (1 mg total) by mouth daily. Patient not taking: Reported on 01/22/2022 01/05/20   Lavena Bullion, DO                                                                                                                                    Past Surgical History Past Surgical History:  Procedure Laterality Date   BIOPSY  12/02/2019   Procedure: BIOPSY;  Surgeon: Lavena Bullion,  DO;  Location: [facility];  Service: Gastroenterology;;   COLONOSCOPY WITH PROPOFOL N/A 12/02/2019   Procedure: COLONOSCOPY WITH PROPOFOL;  Surgeon: Lavena Bullion, DO;  Location: [facility];  Service: Gastroenterology;  Laterality: N/A;   ESOPHAGOGASTRODUODENOSCOPY (EGD) WITH PROPOFOL N/A 12/02/2019   Procedure: ESOPHAGOGASTRODUODENOSCOPY (EGD) WITH PROPOFOL;  Surgeon: Lavena Bullion, DO;  Location: [facility];  Service: Gastroenterology;  Laterality: N/A;   POLYPECTOMY  12/02/2019   Procedure: POLYPECTOMY;  Surgeon: Lavena Bullion, DO;  Location: [facility];  Service: Gastroenterology;;   Family History Family History  Problem Relation Age of Onset   Colon cancer Neg Hx    Esophageal cancer Neg Hx    Rectal cancer Neg Hx    Stomach cancer Neg Hx     Social History Social History   Tobacco Use   Smoking status: Never   Smokeless tobacco: Never  Vaping Use   Vaping Use: Never used  Substance Use Topics   Alcohol use: Not Currently   Drug use: Never    Allergies Patient has no known allergies.  Review of Systems Review of Systems  Gastrointestinal:  Positive for diarrhea.  Neurological:  Positive for syncope and weakness.   Physical Exam Vital Signs  I have reviewed the triage vital signs BP 114/61    Pulse 64    Temp 98.6 F (37 C) (Oral)    Resp 15    Ht 5\' 5"  (1.651 m)    Wt 59 kg    SpO2 100%    BMI 21.64 kg/m   Physical Exam Vitals and nursing note reviewed.  Constitutional:      General: She is not in acute distress.    Appearance: She is well-developed.  HENT:     Head: Normocephalic and atraumatic.  Eyes:     Conjunctiva/sclera: Conjunctivae normal.  Cardiovascular:     Rate and Rhythm: Normal rate and regular rhythm.     Heart sounds: No murmur heard. Pulmonary:     Effort: Pulmonary effort is normal. No respiratory distress.     Breath sounds: Normal breath sounds.  Abdominal:     Palpations: Abdomen is soft.     Tenderness: There is no abdominal tenderness.  Musculoskeletal:        General: No swelling.     Cervical back: Neck supple.  Skin:    General: Skin is warm and dry.     Capillary Refill: Capillary refill takes less than 2 seconds.  Neurological:     Mental Status: She is alert.  Psychiatric:        Mood and Affect: Mood normal.    ED Results and Treatments Labs (all labs ordered are listed, but only abnormal results are displayed) Labs Reviewed  RESP PANEL BY RT-PCR (FLU A&B, COVID) ARPGX2 - Abnormal; Notable for the following components:      Result Value   SARS Coronavirus 2 by RT PCR POSITIVE (*)    All other components within normal limits  LIPASE, BLOOD - Abnormal; Notable for the following components:   Lipase 61 (*)    All other components within normal limits  COMPREHENSIVE METABOLIC PANEL - Abnormal; Notable for the following components:   Sodium 134 (*)    Potassium 2.9 (*)    Glucose, Bld 138 (*)    Calcium 8.7 (*)    All other components within normal limits  CBC -  Abnormal; Notable for the following components:   RBC 2.43 (*)    Hemoglobin 4.1 (*)  HCT 15.3 (*)    MCV 63.0 (*)    MCH 16.9 (*)    MCHC 26.8 (*)    RDW 23.0 (*)    Platelets 65 (*)    nRBC 0.7 (*)    All other components within normal limits  RESP PANEL BY RT-PCR (FLU A&B, COVID) ARPGX2  MAGNESIUM  FERRITIN  IRON AND TIBC  BASIC METABOLIC PANEL  CBC  PROTIME-INR  APTT  HIV ANTIBODY (ROUTINE TESTING W REFLEX)  RPR  TYPE AND SCREEN  PREPARE RBC (CROSSMATCH)                                                                                                                          Radiology US Abdomen Complete  Result Date: 01/22/2022 CLINICAL DATA:  Thrombocytopenia EXAM: ABDOMEN ULTRASOUND COMPLETE COMPARISON:  None. FINDINGS: Gallbladder: Small layering stones measuring up to 1.2 cm. No wall thickening or sonographic Murphy sign. Common bile duct: Diameter: Normal caliber, 4 mm Liver: Coarsened, heterogeneous echotexture suggesting fatty infiltration. No focal abnormality. Portal vein is patent on color Doppler imaging with normal direction of blood flow towards the liver. IVC: No abnormality visualized. Pancreas: Not well visualized due to overlying bowel gas. Spleen: Size and appearance within normal limits. Right Kidney: Length: 8.2 cm. 1.8 cm complex cyst in the mid to lower pole. Thin internal septations noted. No hydronephrosis. Normal echotexture. Left Kidney: Length: 9.7 cm. Echogenicity within normal limits. No mass or hydronephrosis visualized. Abdominal aorta: No aneurysm visualized. Other findings: None. IMPRESSION: Cholelithiasis.  No sonographic evidence of acute cholecystitis. Coarsened, increased echotexture throughout the liver suggesting fatty infiltration. 1.8 cm minimally complex cyst in the lower pole of the right kidney. Electronically Signed   By: Rolm Baptise M.D.   On: 01/22/2022 20:01    Pertinent labs & imaging results that were available during my care of  the patient were reviewed by me and considered in my medical decision making (see MDM for details).  Medications Ordered in ED Medications  pantoprazole (PROTONIX) injection 40 mg (40 mg Intravenous Given 01/22/22 1644)  acetaminophen (TYLENOL) tablet 650 mg (has no administration in time range)    Or  acetaminophen (TYLENOL) suppository 650 mg (has no administration in time range)  ondansetron (ZOFRAN) tablet 4 mg (has no administration in time range)    Or  ondansetron (ZOFRAN) injection 4 mg (has no administration in time range)  influenza vaccine adjuvanted (FLUAD) injection 0.5 mL (has no administration in time range)  0.9 %  sodium chloride infusion (Manually program via Guardrails IV Fluids) ( Intravenous New Bag/Given 01/22/22 2225)  potassium chloride SA (KLOR-CON M) CR tablet 40 mEq (40 mEq Oral Given 01/22/22 1641)  magnesium oxide (MAG-OX) tablet 800 mg (800 mg Oral Given 01/22/22 1642)  potassium chloride SA (KLOR-CON M) CR tablet 40 mEq (40 mEq Oral Given 01/22/22 1937)  Procedures .Critical Care Performed by: Teressa Lower, MD Authorized by: Teressa Lower, MD   Critical care provider statement:    Critical care time (minutes):  30   Critical care was necessary to treat or prevent imminent or life-threatening deterioration of the following conditions: critical anemia.   Critical care was time spent personally by me on the following activities:  Development of treatment plan with patient or surrogate, discussions with consultants, evaluation of patient's response to treatment, examination of patient, ordering and review of laboratory studies, ordering and review of radiographic studies, ordering and performing treatments and interventions, pulse oximetry, re-evaluation of patient's condition and review of old charts  (including critical care  time)  Medical Decision Making / ED Course   This patient presents to the ED for concern of presyncope, dark stools, this involves an extensive number of treatment options, and is a complaint that carries with it a high risk of complications and morbidity.  The differential diagnosis includes GI bleed, symptomatic anemia, orthostatic presyncope  MDM: Patient seen the emergency department for evaluation of dark stools and presyncope.  Physical exam is largely unremarkable.  Laboratory evaluation with a hypokalemia to 2.9 which was repleted here in the emergency department, lipase slightly elevated at 61, patient with critical anemia to 4.1.  Patient given 2 units packed red blood cells.  I consulted the patient's gastroenterologist who will see her tomorrow morning.  Patient admitted to the hospitalist.   Additional history obtained: -Additional history obtained from son -External records from outside source obtained and reviewed including: Chart review including previous notes, labs, imaging, consultation notes   Lab Tests: -I ordered, reviewed, and interpreted labs.   The pertinent results include:   Labs Reviewed  RESP PANEL BY RT-PCR (FLU A&B, COVID) ARPGX2 - Abnormal; Notable for the following components:      Result Value   SARS Coronavirus 2 by RT PCR POSITIVE (*)    All other components within normal limits  LIPASE, BLOOD - Abnormal; Notable for the following components:   Lipase 61 (*)    All other components within normal limits  COMPREHENSIVE METABOLIC PANEL - Abnormal; Notable for the following components:   Sodium 134 (*)    Potassium 2.9 (*)    Glucose, Bld 138 (*)    Calcium 8.7 (*)    All other components within normal limits  CBC - Abnormal; Notable for the following components:   RBC 2.43 (*)    Hemoglobin 4.1 (*)    HCT 15.3 (*)    MCV 63.0 (*)    MCH 16.9 (*)    MCHC 26.8 (*)    RDW 23.0 (*)    Platelets 65 (*)    nRBC 0.7 (*)    All other components  within normal limits  RESP PANEL BY RT-PCR (FLU A&B, COVID) ARPGX2  MAGNESIUM  FERRITIN  IRON AND TIBC  BASIC METABOLIC PANEL  CBC  PROTIME-INR  APTT  HIV ANTIBODY (ROUTINE TESTING W REFLEX)  RPR  TYPE AND SCREEN  PREPARE RBC (CROSSMATCH)    \  Medicines ordered and prescription drug management: Meds ordered this encounter  Medications   0.9 %  sodium chloride infusion (Manually program via Guardrails IV Fluids)   potassium chloride SA (KLOR-CON M) CR tablet 40 mEq   magnesium oxide (MAG-OX) tablet 800 mg   pantoprazole (PROTONIX) injection 40 mg   OR Linked Order Group    acetaminophen (TYLENOL) tablet 650 mg    acetaminophen (TYLENOL) suppository 650 mg  OR Linked Order Group    ondansetron (ZOFRAN) tablet 4 mg    ondansetron (ZOFRAN) injection 4 mg   potassium chloride SA (KLOR-CON M) CR tablet 40 mEq   influenza vaccine adjuvanted (FLUAD) injection 0.5 mL    -I have reviewed the patients home medicines and have made adjustments as needed  Critical interventions Resuscitation with packed red blood cells  Consultations Obtained: I requested consultation with the gastroenterologist,  and discussed lab and imaging findings as well as pertinent plan - they recommend: Medical admission and they will evaluate the patient for morning   Cardiac Monitoring: The patient was maintained on a cardiac monitor.  I personally viewed and interpreted the cardiac monitored which showed an underlying rhythm of: Normal sinus rhythm  Social Determinants of Health:  Factors impacting patients care include: None   Reevaluation: After the interventions noted above, I reevaluated the patient and found that they have :improved  Co morbidities that complicate the patient evaluation  Past Medical History:  Diagnosis Date   Anemia    IDA   Arthritis    Blood transfusion without reported diagnosis    GERD (gastroesophageal reflux disease)    H pylori ulcer    Herpes simplex  esophagitis    PUD (peptic ulcer disease)       Dispostion: I considered admission for this patient, and due to her critical anemia she was admitted.     Final Clinical Impression(s) / ED Diagnoses Final diagnoses:  Anemia, unspecified type  Thrombocytopenia [facility])     @PCDICTATION @    Teressa Lower, MD 01/22/22 2251

## 2022-01-22 NOTE — Assessment & Plan Note (Signed)
-   Check iron studies - Transfused 2 units now - Check posttransfusion H&H

## 2022-01-22 NOTE — Consult Note (Addendum)
Referring Provider: EDP, Dr. Matilde Sprang Primary Care Physician:  Patient, No Pcp Per (Inactive) Primary Gastroenterologist:  Dr. Bryan Lemma  Reason for Consultation:  Anemia and black stools  HPI: Danielle Adkins is a 81 y.o. female with limited past medical history, but history of iron deficiency anemia, HSV esophagitis and duodenal ulcer as well as H. pylori infection who presented to [facility] long hospital today due having black stool at home and near syncope.  The patient reports that the dark stools may have been going on for the past week or 2.  She denies any other GI complaints including nausea, vomiting, abdominal pain, dysphagia, odynophagia, red rectal bleeding, decrease in appetite.  She says her appetite is good.  She is on pantoprazole 40 mg twice daily at home.  She was previously on ferrous sulfate, but she tells me that her PCP told her to discontinue it sometime ago.  Presented with anemia with hemoglobin down to 2.4 g in December 2020 and underwent GI evaluation at that point.  Here hemoglobin is 4.1 g.  MCV is low at 63.  Platelets are low at 65.  Potassium low at 2.9.  EGD and colonoscopy 11/2019:  - Esophageal ulcers with no stigmata of recent bleeding. Biopsied. - LA Grade B esophagitis with no bleeding. Biopsied. - Gastritis. Biopsied. - Non-bleeding duodenal ulcer with no stigmata of bleeding. Biopsied. - Normal first portion of the duodenum and second portion of the duodenum. Biopsied.  - Preparation of the colon was fair. - Hemorrhoids found on perianal exam. - Six 4 to 8 mm polyps in the sigmoid colon, in the transverse colon and in the ascending colon, removed with a cold snare. Resected and retrieved. - Stool in the entire examined colon. - Non-bleeding internal hemorrhoids. - The examined portion of the ileum was normal.   A. 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 STOMACH, BIOPSY:  - Gastric antral and oxyntic mucosa with Helicobacter pylori-associated gastritis (confirmed with Warthin Starry stain)  - Intestinal metaplasia of antral mucosa, negative for dysplasia   D. ESOPHAGUS, BIOPSY:  - Esophageal squamous and cardiac mucosa showing severe acute  esophagitis with ulceration and cytopathic changes suggestive of herpes  esophagitis.  See comment  - Negative for intestinal metaplasia or dysplasia   E. COLON, ASCENDING, TRANSVERSE, SIGMOID, POLYPECTOMY:  - Tubular adenoma(s) without high-grade dysplasia or malignancy  - Inflammatory polyp  - Hyperplastic polyp   ADDENDUM:   HSVI immunohistochemistry is POSITIVE; supporting the impression of  Herpes esophagitis.  HSVII is NEGATIVE.   EGD 01/2020 by Dr. Bryan Lemma:  - Normal upper third of esophagus, middle third of esophagus and lower third of esophagus. - Gastroesophageal flap valve classified as Hill Grade IV (no fold, wide open lumen, hiatal hernia present). - Gastritis. Biopsied. - Normal duodenal bulb. Biopsied. - Dilated lacteals were found in the duodenum. Biopsied. - Previously noted duodenal ulcer/peptic duodenitis and esophagitis have since healed.  1. Surgical [P], duodenal bulb, 2nd portion and duodenum and distal duodenum - DUODENAL MUCOSA WITH NO SIGNIFICANT PATHOLOGIC FINDINGS. - NEGATIVE FOR INCREASED INTRAEPITHELIAL LYMPHOCYTES AND VILLOUS ARCHITECTURAL CHANGES. 2. Surgical [P], gastric antrum and gastric body - GASTRIC ANTRAL AND OXYNTIC MUCOSA WITH MILD CHRONIC GASTRITIS AND REACTIVE CHANGES. - WARTHIN-STARRY STAIN IS NEGATIVE FOR HELICOBACTER PYLORI.   Past Medical History:  Diagnosis  Date   Anemia    IDA   Arthritis    Blood transfusion without reported diagnosis    GERD (gastroesophageal reflux disease)     Past Surgical History:  Procedure  Laterality Date   BIOPSY  12/02/2019   Procedure: BIOPSY;  Surgeon: Lavena Bullion, DO;  Location: [facility];  Service: Gastroenterology;;   COLONOSCOPY WITH PROPOFOL N/A 12/02/2019   Procedure: COLONOSCOPY WITH PROPOFOL;  Surgeon: Lavena Bullion, DO;  Location: [facility];  Service: Gastroenterology;  Laterality: N/A;   ESOPHAGOGASTRODUODENOSCOPY (EGD) WITH PROPOFOL N/A 12/02/2019   Procedure: ESOPHAGOGASTRODUODENOSCOPY (EGD) WITH PROPOFOL;  Surgeon: Lavena Bullion, DO;  Location: [facility];  Service: Gastroenterology;  Laterality: N/A;   POLYPECTOMY  12/02/2019   Procedure: POLYPECTOMY;  Surgeon: Lavena Bullion, DO;  Location: [facility];  Service: Gastroenterology;;    Prior to Admission medications   Medication Sig Start Date End Date Taking? Authorizing Provider  ferrous sulfate 325 (65 FE) MG EC tablet Take 1 tablet (325 mg total) by mouth 2 (two) times daily. 01/05/20   Cirigliano, Vito V, DO  folic acid (FOLVITE) 1 MG tablet Take 1 tablet (1 mg total) by mouth daily. 01/05/20   Cirigliano, Vito V, DO  ibuprofen (ADVIL) 200 MG tablet Take 200 mg by mouth every 6 (six) hours as needed for fever or moderate pain.    [provider]  pantoprazole (PROTONIX) 40 MG tablet TAKE 1 TABLET BY MOUTH TWICE A DAY 08/28/20   Cirigliano, Vito V, DO    Current Facility-Administered Medications  Medication Dose Route Frequency Provider Last Rate Last Admin   0.9 %  sodium chloride infusion (Manually program via Guardrails IV Fluids)   Intravenous Once Kommor, [place], MD       magnesium oxide (MAG-OX) tablet 800 mg  800 mg Oral Once Kommor, Madison, MD       potassium chloride SA (KLOR-CON M) CR tablet 40 mEq  40 mEq Oral Once Kommor, [place], MD       Current Outpatient Medications  Medication Sig Dispense Refill   ferrous sulfate 325 (65 FE) MG EC tablet Take 1 tablet (325 mg total) by mouth 2 (two) times daily. 60 tablet 3   folic acid (FOLVITE) 1 MG tablet Take 1  tablet (1 mg total) by mouth daily. 30 tablet 3   ibuprofen (ADVIL) 200 MG tablet Take 200 mg by mouth every 6 (six) hours as needed for fever or moderate pain.     pantoprazole (PROTONIX) 40 MG tablet TAKE 1 TABLET BY MOUTH TWICE A DAY 180 tablet 1    Allergies as of 01/22/2022   (No Known Allergies)    Family History  Problem Relation Age of Onset   Colon cancer Neg Hx    Esophageal cancer Neg Hx    Rectal cancer Neg Hx    Stomach cancer Neg Hx     Social History   Socioeconomic History   Marital status: Widowed    Spouse name: Not on file   Number of children: Not on file   Years of education: Not on file   Highest education level: Not on file  Occupational History   Not on file  Tobacco Use   Smoking status: Never   Smokeless tobacco: Never  Vaping Use   Vaping Use: Never used  Substance and Sexual Activity   Alcohol use: Not Currently   Drug use: Never   Sexual activity: Not on file  Other Topics Concern   Not  on file  Social History Narrative   Not on file   Social Determinants of Health   Financial Resource Strain: Not on file  Food Insecurity: Not on file  Transportation Needs: Not on file  Physical Activity: Not on file  Stress: Not on file  Social Connections: Not on file  Intimate Partner Violence: Not on file    Review of Systems: ROS is O/W negative except as mentioned in HPI.  Physical Exam: Vital signs in last 24 hours: Temp:  [98.5 F (36.9 C)] 98.5 F (36.9 C) (01/24 1341) Pulse Rate:  [67-92] 67 (01/24 1545) Resp:  [16-18] 16 (01/24 1545) BP: (117-123)/(55-71) 117/55 (01/24 1545) SpO2:  [98 %-100 %] 100 % (01/24 1545) Weight:  [59 kg] 59 kg (01/24 1345)   General:  Alert, Well-developed, well-nourished, pleasant and cooperative in NAD Head:  Normocephalic and atraumatic. Eyes:  Sclera clear, no icterus.  Conjunctiva pale. Ears:  Normal auditory acuity. Mouth:  No deformity or lesions.   Lungs:  Clear throughout to auscultation.   No wheezes, crackles, or rhonchi.  Heart:  Regular rate and rhythm; no murmurs, clicks, rubs, or gallops. Abdomen:  Soft, non-distended.  BS present.  Non-tender.   Msk:  Symmetrical without gross deformities. Pulses:  Normal pulses noted. Extremities:  Without clubbing or edema. Neurologic:  Alert and oriented x 4;  grossly normal neurologically. Skin:  Intact without significant lesions or rashes. Psych:  Alert and cooperative. Normal mood and affect.  Lab Results: Recent Labs    01/22/22 1527  WBC 6.8  HGB 4.1*  HCT 15.3*  PLT 65*   BMET Recent Labs    01/22/22 1527  NA 134*  K 2.9*  CL 99  CO2 26  GLUCOSE 138*  BUN 9  CREATININE 0.52  CALCIUM 8.7*   LFT Recent Labs    01/22/22 1527  PROT 7.1  ALBUMIN 3.7  AST 21  ALT 13  ALKPHOS 63  BILITOT 0.7   IMPRESSION:  *81 year old female with history of iron deficiency anemia:  Hgb 11.9 grams 02/2020.  Is at 4.1 grams here today.  Was down to 2.5 grams in 11/2019.  She was previously on ferrous sulfate, but tells me that quite some time ago her PCP told her to stop it.  I am assuming this because her blood counts had improved.  Having dark stools for the past 1-2 weeks.  MCV is low at 63. *Thrombocytopenia: Platelets 65.  Likely due to GI bleeding. *History of HSV esophagitis and DU on EGD in 11/2019.  Has been on pantoprazole 40 mg p.o. twice daily at home. *Hypokalemia:  K+ 2.9  PLAN: -Transfuse packed red blood cells. -Trend labs. -Pantoprazole 40 mg IV BID. -Possible EGD on 1/25 so we will make her n.p.o. after midnight. -Primary service will replete potassium. -Will check iron studies.   Laban Emperor. Zehr  01/22/2022, 4:30 PM   _______________________________________________________________________________________________________________________________________  Velora Heckler GI MD note:  I personally examined the patient, reviewed the data and agree with the assessment and plan described above.  She has had  dark stools for about a week without any other GI symptoms. She rarely takes NSAIDs but did take 2 ASA in the past week.  She is Covid + but has had no fevers, chills, cough or SOB. She received 2 units of blood and Hb up from 4.1 to 7 this AM. Obviously she's been having some GI bleeding, possibly from UGI tract.  I am concerned that her plts  were 65 yesterday and are in 40s this morning.  2 years ago when she presented with ulcer related bleeding and Hb in the 2s her Plts were never low.  I wonder if she has a hematologic issue as the primary problem here.  Before going ahead with invasive GI testing I think she needs a heme workup.  Should probably transfuse another 1 unit PRBC and ? Tranfuse platelets as well. I will message the hospitalist team about this.  For now we are holding on EGD and so I will let her eat. Continue IV PPI BID.   Owens Loffler, MD [facility] Gastroenterology Pager [phone]

## 2022-01-22 NOTE — H&P (Signed)
History and Physical    Patient: Danielle Adkins KDT:[id number] DOB: [birth year] DOA: 01/22/2022 DOS: the patient was seen and examined on 01/22/2022 PCP: Patient, No Pcp Per (Inactive)  Patient coming from: Home  Chief Complaint: Melena, weakness  HPI:  Danielle Adkins is a 81 y.o. F with history HSV esophagitis, H. pylori with bleeding esophageal and gastric ulcers 12/21 and iron deficiency anemia who presents with 1 week melena, lightheadedness, and fatigue.  History collected from the patient's son at the patient has mild dementia and is somewhat fuzzy with details.  Evidently she has been in good health for the last year after her endoscopy last spring showed HSV esophagitis.  Her hemoglobin got up to 12 g/dL.  She was taking iron and pantoprazole fairly regularly.  Then recently son started to notice she had runny black stools.  Then she started to complain of fatigue, lightheadedness so he brought her to the emergency room.  In the ER, hemoglobin 4.2, platelets 65, potassium 2.7, creatinine normal.  Started on PPI and admitted to hospitalists.    Review of Systems: Review of Systems  Constitutional: Positive for malaise/fatigue. Negative for chills and fever.  Gastrointestinal: Positive for diarrhea and melena. Negative for abdominal pain, blood in stool and vomiting.  Neurological: Positive for weakness. Negative for focal weakness and loss of consciousness.  All other systems reviewed and are negative.         Past Medical History:  Diagnosis Date   Anemia    IDA   Arthritis    Blood transfusion without reported diagnosis    GERD (gastroesophageal reflux disease)    H pylori ulcer    Herpes simplex esophagitis    PUD (peptic ulcer disease)    Past Surgical History:  Procedure Laterality Date   BIOPSY  12/02/2019   Procedure: BIOPSY;  Surgeon: Danielle Bullion, DO;  Location: [facility];  Service: Gastroenterology;;   COLONOSCOPY WITH PROPOFOL N/A 12/02/2019    Procedure: COLONOSCOPY WITH PROPOFOL;  Surgeon: Danielle Bullion, DO;  Location: [facility];  Service: Gastroenterology;  Laterality: N/A;   ESOPHAGOGASTRODUODENOSCOPY (EGD) WITH PROPOFOL N/A 12/02/2019   Procedure: ESOPHAGOGASTRODUODENOSCOPY (EGD) WITH PROPOFOL;  Surgeon: Danielle Bullion, DO;  Location: [facility];  Service: Gastroenterology;  Laterality: N/A;   POLYPECTOMY  12/02/2019   Procedure: POLYPECTOMY;  Surgeon: Danielle Bullion, DO;  Location: [facility];  Service: Gastroenterology;;   Social History:  reports that she has never smoked. She has never used smokeless tobacco. She reports that she does not currently use alcohol. She reports that she does not use drugs.  Denies alcohol use.  Denies smoking.  Lives with her son.  No Known Allergies  Family History  Problem Relation Age of Onset   Colon cancer Neg Hx    Esophageal cancer Neg Hx    Rectal cancer Neg Hx    Stomach cancer Neg Hx     Prior to Admission medications   Medication Sig Start Date End Date Taking? Authorizing Provider  Dextromethorphan-Benzocaine (COUGH/SORE THROAT LOZENGES MT) Use as directed 1 lozenge in the mouth or throat as needed (for a sore throat/irritation).   Yes [provider]  pantoprazole (PROTONIX) 40 MG tablet TAKE 1 TABLET BY MOUTH TWICE A DAY Patient taking differently: Take 40 mg by mouth 2 (two) times daily before a meal. 08/28/20  Yes Cirigliano, [place], DO  [place] 10-19-19 MG PACK Take 1 packet by mouth every 6 (six) hours as needed (for cold-like symptoms/mix  and drink).   Yes [provider]  ferrous sulfate 325 (65 FE) MG EC tablet Take 1 tablet (325 mg total) by mouth 2 (two) times daily. Patient not taking: Reported on 01/22/2022 01/05/20   Cirigliano, Dominic [place], DO  folic acid (FOLVITE) 1 MG tablet Take 1 tablet (1 mg total) by mouth daily. Patient not taking: Reported on 01/22/2022 01/05/20   Danielle Bullion, DO    Physical  Exam: Vitals:   01/22/22 1700 01/22/22 1730 01/22/22 1800 01/22/22 1802  BP: 126/65 124/60  98/62  Pulse: 80 68  83  Resp: 19 15  19   Temp:   98.5 F (36.9 C) 98.5 F (36.9 C)  TempSrc:   Oral Oral  SpO2: 100% 100%  100%  Weight:      Height:       General appearance: Thin elderly adult female, lying in bed, no acute distress     HEENT: Anicteric, conjunctival pale, lids and lashes normal.  No nasal forming, discharge, or epistaxis. Skin: Moderate pallor of the palms, no suspicious rashes Cardiac: RRR, no murmurs rubs or gallops, no lower extremity edema, no JVD Respiratory: Normal respiratory rate and rhythm, no rales or wheezes Abdomen: Soft without tenderness palpation or guarding, no splenomegaly appreciable on exam MSK: Moderately reduced subcutaneous muscle mass and fat, normal for age Neuro: Awake and alert, extraocular movements intact, moves upper extremities and normal strength and coordination, speech fluent, attention normal Psych: Normal, judgment insight appear mildly impaired by dementia.  Oriented to place, situation, short-term memory seems slightly impaired    Data Reviewed: Discussed with GI My review of labs and imaging is notable for hypokalemia, microcytic anemia, thrombocytopenia.  Transaminases normal.   Assessment/Plan * Acute blood loss anemia- (present on admission) - Check iron studies - Transfused 2 units now - Check posttransfusion H&H  Melena - Start PPI - Consult GI  Hypokalemia - Check magnesium - Supplement potassium  Iron deficiency anemia- (present on admission)    Esophagitis History of herpes esophagitis last year, confirmed with immunostaining.  Status post acyclovir. - Consult GI - Check HIV RPR  Thrombocytopenia (Danielle Adkins) This is a new issue.  Interesting.  Would not expect this from GI bleed alone.  Denies alcohol use. - Obtain US abdomen to look at liver and spleen -Trend platelets -Check coags  Dementia without  behavioral disturbance ([place]) - Recommend outpatient follow-up    Advance Care Planning: Full code  Consults: GI, discussed with Dr. Rush Landmark  Family Communication: Son by phone, CODE STATUS confirmed  Severity of Illness: The appropriate patient status for this patient is INPATIENT. Inpatient status is judged to be reasonable and necessary in order to provide the required intensity of service to ensure the patient's safety. The patient's presenting symptoms, physical exam findings, and initial radiographic and laboratory data in the context of their chronic comorbidities is felt to place them at high risk for further clinical deterioration. Furthermore, it is not anticipated that the patient will be medically stable for discharge from the hospital within 2 midnights of admission.   * I certify that at the point of admission it is my clinical judgment that the patient will require inpatient hospital care spanning beyond 2 midnights from the point of admission due to high intensity of service, high risk for further deterioration and high frequency of surveillance required.*   Author: Edwin Dada, MD 01/22/2022 6:13 PM  For on call review [email or web address].

## 2022-01-22 NOTE — ED Provider Triage Note (Signed)
Emergency Medicine Provider Triage Evaluation Note  Danielle Adkins , a 81 y.o. female  was evaluated in triage.  Pt complains of weakness and dizziness for approximately 1 week.  She endorses watery black stools, and family reports seeing blood today.  EMS reports that the stool was green and watery.  Patient has a GI appointment tomorrow but family was very concerned.  Patient is normally ambulatory without a cane or a walker and feels that she will fall without one currently.  She denies chest pain, shortness of breath, abdominal pain, nausea/vomiting.  Review of Systems  Positive: Weakness, dizziness, watery stool Negative: As above  Physical Exam  BP 123/71    Pulse 92    Temp 98.5 F (36.9 C) (Oral)    Resp 18    Ht 5\' 5"  (1.651 m)    Wt 59 kg    SpO2 100%    BMI 21.64 kg/m  Gen:   Awake, no distress, frail-appearing Resp:  Normal effort  MSK:   Moves extremities without difficulty  Other:  Abdomen is soft, nondistended, nontender to palpation  Medical Decision Making  Medically screening exam initiated at 2:46 PM.  Appropriate orders placed.  Danielle Adkins was informed that the remainder of the evaluation will be completed by another provider, this initial triage assessment does not replace that evaluation, and the importance of remaining in the ED until their evaluation is complete.     Tonye Pearson, [place] 01/22/22 1447

## 2022-01-22 NOTE — Assessment & Plan Note (Signed)
-   Start PPI - Consult GI

## 2022-01-23 ENCOUNTER — Encounter (HOSPITAL_COMMUNITY)
Admission: EM | Disposition: A | Discharge status: Home / Self Care | Payer: Self-pay | Source: Home / Self Care | Attending: Internal Medicine

## 2022-01-23 ENCOUNTER — Ambulatory Visit: Payer: Medicare HMO | Admitting: Nurse Practitioner

## 2022-01-23 DIAGNOSIS — D61818 Other pancytopenia: Secondary | ICD-10-CM | POA: Diagnosis present

## 2022-01-23 DIAGNOSIS — U071 COVID-19: Secondary | ICD-10-CM | POA: Diagnosis present

## 2022-01-23 LAB — CBC WITH DIFFERENTIAL/PLATELET
Abs Immature Granulocytes: 0.03 10*3/uL (ref 0.00–0.07)
Abs Immature Granulocytes: 0.07 10*3/uL (ref 0.00–0.07)
Basophils Absolute: 0 10*3/uL (ref 0.0–0.1)
Basophils Absolute: 0 10*3/uL (ref 0.0–0.1)
Basophils Relative: 0 %
Basophils Relative: 0 %
Eosinophils Absolute: 0 10*3/uL (ref 0.0–0.5)
Eosinophils Absolute: 0 10*3/uL (ref 0.0–0.5)
Eosinophils Relative: 0 %
Eosinophils Relative: 0 %
HCT: 23.3 % — ABNORMAL LOW (ref 36.0–46.0)
HCT: 27.1 % — ABNORMAL LOW (ref 36.0–46.0)
Hemoglobin: 7 g/dL — ABNORMAL LOW (ref 12.0–15.0)
Hemoglobin: 8.4 g/dL — ABNORMAL LOW (ref 12.0–15.0)
Immature Granulocytes: 1 %
Immature Granulocytes: 1 %
Lymphocytes Relative: 18 %
Lymphocytes Relative: 8 %
Lymphs Abs: 0.6 10*3/uL — ABNORMAL LOW (ref 0.7–4.0)
Lymphs Abs: 0.5 10*3/uL — ABNORMAL LOW (ref 0.7–4.0)
MCH: 22.4 pg — ABNORMAL LOW (ref 26.0–34.0)
MCH: 23.9 pg — ABNORMAL LOW (ref 26.0–34.0)
MCHC: 30 g/dL (ref 30.0–36.0)
MCHC: 31 g/dL (ref 30.0–36.0)
MCV: 74.7 fL — ABNORMAL LOW (ref 80.0–100.0)
MCV: 77 fL — ABNORMAL LOW (ref 80.0–100.0)
Monocytes Absolute: 0.5 10*3/uL (ref 0.1–1.0)
Monocytes Absolute: 0.6 10*3/uL (ref 0.1–1.0)
Monocytes Relative: 14 %
Monocytes Relative: 10 %
Neutro Abs: 2.3 10*3/uL (ref 1.7–7.7)
Neutro Abs: 5 10*3/uL (ref 1.7–7.7)
Neutrophils Relative %: 67 %
Neutrophils Relative %: 81 %
Platelets: 48 10*3/uL — ABNORMAL LOW (ref 150–400)
Platelets: 83 10*3/uL — ABNORMAL LOW (ref 150–400)
RBC: 3.12 MIL/uL — ABNORMAL LOW (ref 3.87–5.11)
RBC: 3.52 MIL/uL — ABNORMAL LOW (ref 3.87–5.11)
WBC: 3.5 10*3/uL — ABNORMAL LOW (ref 4.0–10.5)
WBC: 6.3 10*3/uL (ref 4.0–10.5)
nRBC: 2 % — ABNORMAL HIGH (ref 0.0–0.2)
nRBC: 1.9 % — ABNORMAL HIGH (ref 0.0–0.2)

## 2022-01-23 LAB — BASIC METABOLIC PANEL
Anion gap: 5 (ref 5–15)
BUN: 5 mg/dL — ABNORMAL LOW (ref 8–23)
CO2: 27 mmol/L (ref 22–32)
Calcium: 8.2 mg/dL — ABNORMAL LOW (ref 8.9–10.3)
Chloride: 107 mmol/L (ref 98–111)
Creatinine, Ser: 0.43 mg/dL — ABNORMAL LOW (ref 0.44–1.00)
GFR, Estimated: >60 mL/min (ref >60)
Glucose, Bld: 87 mg/dL (ref 70–99)
Potassium: 4.8 mmol/L (ref 3.5–5.1)
Sodium: 139 mmol/L (ref 135–145)

## 2022-01-23 LAB — APTT: aPTT: 27 seconds (ref 24–36)

## 2022-01-23 LAB — CBC
HCT: 23 % — ABNORMAL LOW (ref 36.0–46.0)
Hemoglobin: 7 g/dL — ABNORMAL LOW (ref 12.0–15.0)
MCH: 22.5 pg — ABNORMAL LOW (ref 26.0–34.0)
MCHC: 30.4 g/dL (ref 30.0–36.0)
MCV: 74 fL — ABNORMAL LOW (ref 80.0–100.0)
Platelets: 46 10*3/uL — ABNORMAL LOW (ref 150–400)
RBC: 3.11 MIL/uL — ABNORMAL LOW (ref 3.87–5.11)
WBC: 3.4 10*3/uL — ABNORMAL LOW (ref 4.0–10.5)
nRBC: 2 % — ABNORMAL HIGH (ref 0.0–0.2)

## 2022-01-23 LAB — HIV ANTIBODY (ROUTINE TESTING W REFLEX): HIV Screen 4th Generation wRfx: NONREACTIVE

## 2022-01-23 LAB — RETICULOCYTES
Immature Retic Fract: 16.4 % — ABNORMAL HIGH (ref 2.3–15.9)
RBC.: 3.13 MIL/uL — ABNORMAL LOW (ref 3.87–5.11)
Retic Count, Absolute: 26.3 10*3/uL (ref 19.0–186.0)
Retic Ct Pct: 0.8 % (ref 0.4–3.1)

## 2022-01-23 LAB — TECHNOLOGIST SMEAR REVIEW

## 2022-01-23 LAB — PROTIME-INR
INR: 1 (ref 0.8–1.2)
Prothrombin Time: 12.9 seconds (ref 11.4–15.2)

## 2022-01-23 LAB — RPR: RPR Ser Ql: NONREACTIVE

## 2022-01-23 LAB — FERRITIN: Ferritin: 9 ng/mL — ABNORMAL LOW (ref 11–307)

## 2022-01-23 LAB — PREPARE RBC (CROSSMATCH)

## 2022-01-23 LAB — VITAMIN B12: Vitamin B-12: 356 pg/mL (ref 180–914)

## 2022-01-23 LAB — IRON AND TIBC
Iron: 126 ug/dL (ref 28–170)
Saturation Ratios: 29 % (ref 10.4–31.8)
TIBC: 435 ug/dL (ref 250–450)
UIBC: 309 ug/dL

## 2022-01-23 LAB — FIBRINOGEN: Fibrinogen: 310 mg/dL (ref 210–475)

## 2022-01-23 LAB — MAGNESIUM: Magnesium: 1.8 mg/dL (ref 1.7–2.4)

## 2022-01-23 SURGERY — ESOPHAGOGASTRODUODENOSCOPY (EGD) WITH PROPOFOL
Anesthesia: Monitor Anesthesia Care

## 2022-01-23 MED ORDER — NIRMATRELVIR/RITONAVIR (PAXLOVID) TABLET (RENAL DOSING): 2.0000 | ORAL_TABLET | Freq: Two times a day (BID) | ORAL | Status: DC

## 2022-01-23 MED ORDER — SODIUM CHLORIDE 0.9 % IV SOLN: INTRAVENOUS | Status: DC

## 2022-01-23 MED ORDER — SODIUM CHLORIDE 0.9% IV SOLUTION: Freq: Once | INTRAVENOUS | Status: AC

## 2022-01-23 MED ORDER — NIRMATRELVIR/RITONAVIR (PAXLOVID)TABLET
3.0000 | ORAL_TABLET | Freq: Two times a day (BID) | ORAL | Status: DC
  Administered 2022-01-23 – 2022-01-25 (×6): 3 via ORAL
  Filled 2022-01-23: qty 30

## 2022-01-23 NOTE — Assessment & Plan Note (Addendum)
Son reports intermittent coughing for last few days. No fever no chills. Patient denies any chest pain. Ct 20.4. Discussed with son and pharmacy.  Will initiate therapy with Paxlovid.  Renally dosed. Monitor.

## 2022-01-23 NOTE — Assessment & Plan Note (Addendum)
Melena. Pancytopenia.Associated with thrombocytopenia and leukopenia History of herpes esophagitis last year, confirmed with immunostaining.  Status post acyclovir. Hemoglobin 4.1 on admission.  Improving to 8.4 after transfusion. MCV low. Reticulocyte count normal.  Iron level elevated although this is after PRBC transfusion therefore could be falsely elevated.  V35 level and folic acid level relatively normal. SP 3 PRBC transfusion. Platelet count also significantly low on admission. Initially discussed with hematology recommended no further work-up necessary. Smear actually showing schistocytes which is concerning therefore requested hematology consultation. Appreciate assistance. EGD done.  Hemoglobin remained stable. Continue PPI twice daily for now.

## 2022-01-23 NOTE — Plan of Care (Signed)

## 2022-01-23 NOTE — Progress Notes (Signed)
Progress Note   Patient: Danielle Adkins [id number]:[id number] DOB: 04-Dec-1941 DOA: 01/22/2022     1 DOS: the patient was seen and examined on 01/23/2022   Brief hospital course: Mrs. Danielle Adkins is a 81 y.o. F with history HSV esophagitis, H. pylori with bleeding esophageal and gastric ulcers 12/21 and iron deficiency anemia who presents with 1 week melena, lightheadedness, and fatigue.  History collected from the patient's son at the patient has mild dementia and is somewhat fuzzy with details.  Evidently she has been in good health for the last year after her endoscopy last spring showed HSV esophagitis.  Her hemoglobin got up to 12 g/dL.  She was taking iron and pantoprazole fairly regularly.  Then recently son started to notice she had runny black stools.  Then she started to complain of fatigue, lightheadedness so he brought her to the emergency room.  In the ER, hemoglobin 4.2, platelets 65, potassium 2.7, creatinine normal.  Started on PPI and admitted to hospitalists.  Assessment and Plan * Acute blood loss anemia- (present on admission) Melena. Associated with thrombocytopenia and leukopenia History of herpes esophagitis last year, confirmed with immunostaining.  Status post acyclovir. Hemoglobin 4.1 on admission.  Improving to 8.4 after transfusion. MCV low. Reticulocyte count normal.  Iron level elevated although this is after PRBC transfusion therefore could be falsely elevated.  I96 level and folic acid level relatively normal. SP 3 PRBC transfusion. Platelet count also significantly low on admission.  Discussed with hematology.  Suspect that platelet count is low secondary to hemorrhage as well and worsening platelet count after PRBC transfusion likely in dilution. Platelet count actually improving after transfusion of platelet. Will monitor for now.  INR level, fibrinogen level normal.  Smear no evidence of schistocytes. Hematology thinks patient does not require any inpatient consultation  for now. GI consulted.  Continue PPI twice daily for now. Scheduled for EGD tomorrow as long as platelet count remains above 50 and hemoglobin remains above 7.  Esophagitis- (present on admission) History of herpes esophagitis last year, confirmed with immunostaining.  Status post acyclovir. - Consult GI - Check HIV RPR  Melena- (present on admission) - Start PPI - Consult GI  Iron deficiency anemia- (present on admission)    Thrombocytopenia ([place])- (present on admission) This is a new issue.  Interesting.  Would not expect this from GI bleed alone.  Denies alcohol use. - Obtain US abdomen to look at liver and spleen -Trend platelets -Check coags  COVID-19 virus infection- (present on admission) Son reports intermittent coughing for last few days. No fever no chills. Patient denies any chest pain. Ct 20.4. Discussed with son and pharmacy.  Will initiate therapy with Paxlovid.  Renally dosed. Monitor.  Hypokalemia- (present on admission) Corrected.  Monitor.  Dementia without behavioral disturbance (Danielle Adkins)- (present on admission) Does not appear to have any acute delirium or behavioral issues. Recommend outpatient follow-up     Subjective: No nausea no vomiting no fever no chills.  No acute complaint.  No chest pain.  No abdominal pain.  No diarrhea.  No cough no shortness of breath.  No headache.  Objective  Vitals:   01/23/22 0954 01/23/22 1200 01/23/22 1223 01/23/22 1419  BP: (!) 128/59 137/63 (!) 148/52 113/64  Pulse: 60 (!) 56 66 62  Resp: 18 18 18 18   Temp: 97.9 F (36.6 C) 98.9 F (37.2 C) 98.9 F (37.2 C) 98.8 F (37.1 C)  TempSrc: Oral Oral Oral Oral  SpO2: 100% 100% 100% 100%  Weight:  Height:        General: Appear in mild distress, no Rash; Oral Mucosa Clear, moist. no Abnormal Neck Mass Or lumps, Conjunctiva normal  Cardiovascular: S1 and S2 Present, no Murmur, Respiratory: good respiratory effort, Bilateral Air entry present and CTA, no  Crackles, no wheezes Abdomen: Bowel Sound present, Soft and no tenderness Extremities: no Pedal edema Neurology: alert and oriented to time, place, and person affect appropriate. no new focal deficit Gait not checked due to patient safety concerns   Data Reviewed:  I have Reviewed nursing notes, Vitals, and Lab results since pt's last encounter. Pertinent lab results CBC shows improvement in hemoglobin to 8.4, and platelet count 83, BMP shows stable electrolytes and creatinine, magnesium level normal, iron level elevated I have ordered test including CBC and BMP I have reviewed the last note from gastroenterology,  and discussed pt's care plan and test results with them.  Also discussed the case with hematology Dr. Julien Nordmann.  Family Communication: Discussed with son on the phone.  Disposition: Status is: Inpatient  Remains inpatient appropriate because: Requires EGD as well as transfusion support.         Author: Berle Mull, MD 01/23/2022 7:54 PM  For on call review [email or web address].

## 2022-01-23 NOTE — Progress Notes (Signed)
°  Transition of Care (TOC) Screening Note   Patient Details  Name: Danielle Adkins Date of Birth: March 21, 1941   Transition of Care [facility]) CM/SW Contact:    Lennart Pall, LCSW Phone Number: 01/23/2022, 1:11 PM    Transition of Care Department [facility]) has reviewed patient and no TOC needs have been identified at this time. We will continue to monitor patient advancement through interdisciplinary progression rounds. If new patient transition needs arise, please place a TOC consult.

## 2022-01-23 NOTE — Assessment & Plan Note (Signed)
Corrected.  Monitor.

## 2022-01-23 NOTE — Assessment & Plan Note (Signed)
Does not appear to have any acute delirium or behavioral issues. Recommend outpatient follow-up

## 2022-01-24 ENCOUNTER — Encounter (HOSPITAL_COMMUNITY): Payer: Self-pay | Admitting: Family Medicine

## 2022-01-24 ENCOUNTER — Encounter (HOSPITAL_COMMUNITY)
Admission: EM | Disposition: A | Discharge status: Home / Self Care | Payer: Self-pay | Source: Home / Self Care | Attending: Internal Medicine

## 2022-01-24 ENCOUNTER — Inpatient Hospital Stay (HOSPITAL_COMMUNITY): Payer: Medicare HMO | Admitting: Anesthesiology

## 2022-01-24 DIAGNOSIS — K31819 Angiodysplasia of stomach and duodenum without bleeding: Secondary | ICD-10-CM

## 2022-01-24 DIAGNOSIS — K921 Melena: Principal | ICD-10-CM

## 2022-01-24 DIAGNOSIS — D62 Acute posthemorrhagic anemia: Secondary | ICD-10-CM | POA: Diagnosis not present

## 2022-01-24 HISTORY — PX: HOT HEMOSTASIS: SHX5433

## 2022-01-24 HISTORY — PX: ESOPHAGOGASTRODUODENOSCOPY (EGD) WITH PROPOFOL: SHX5813

## 2022-01-24 HISTORY — PX: BIOPSY: SHX5522

## 2022-01-24 LAB — CBC
HCT: 26.6 % — ABNORMAL LOW (ref 36.0–46.0)
Hemoglobin: 8.2 g/dL — ABNORMAL LOW (ref 12.0–15.0)
MCH: 24.1 pg — ABNORMAL LOW (ref 26.0–34.0)
MCHC: 30.8 g/dL (ref 30.0–36.0)
MCV: 78.2 fL — ABNORMAL LOW (ref 80.0–100.0)
Platelets: 76 10*3/uL — ABNORMAL LOW (ref 150–400)
RBC: 3.4 MIL/uL — ABNORMAL LOW (ref 3.87–5.11)
WBC: 6.5 10*3/uL (ref 4.0–10.5)
nRBC: 1.7 % — ABNORMAL HIGH (ref 0.0–0.2)

## 2022-01-24 LAB — CBC WITH DIFFERENTIAL/PLATELET
Abs Immature Granulocytes: 0.08 10*3/uL — ABNORMAL HIGH (ref 0.00–0.07)
Basophils Absolute: 0 10*3/uL (ref 0.0–0.1)
Basophils Relative: 0 %
Eosinophils Absolute: 0 10*3/uL (ref 0.0–0.5)
Eosinophils Relative: 0 %
HCT: 29.1 % — ABNORMAL LOW (ref 36.0–46.0)
Hemoglobin: 8.9 g/dL — ABNORMAL LOW (ref 12.0–15.0)
Immature Granulocytes: 1 %
Lymphocytes Relative: 8 %
Lymphs Abs: 1.1 10*3/uL (ref 0.7–4.0)
MCH: 23.9 pg — ABNORMAL LOW (ref 26.0–34.0)
MCHC: 30.6 g/dL (ref 30.0–36.0)
MCV: 78.2 fL — ABNORMAL LOW (ref 80.0–100.0)
Monocytes Absolute: 0.9 10*3/uL (ref 0.1–1.0)
Monocytes Relative: 6 %
Neutro Abs: 12.1 10*3/uL — ABNORMAL HIGH (ref 1.7–7.7)
Neutrophils Relative %: 85 %
Platelets: 97 10*3/uL — ABNORMAL LOW (ref 150–400)
RBC: 3.72 MIL/uL — ABNORMAL LOW (ref 3.87–5.11)
WBC: 14.2 10*3/uL — ABNORMAL HIGH (ref 4.0–10.5)
nRBC: 0.5 % — ABNORMAL HIGH (ref 0.0–0.2)

## 2022-01-24 LAB — TYPE AND SCREEN
ABO/RH(D): O POS
Antibody Screen: NEGATIVE
Unit division: 0
Unit division: 0
Unit division: 0

## 2022-01-24 LAB — BPAM RBC
Blood Product Expiration Date: 202302212359
Blood Product Expiration Date: 202302212359
Blood Product Expiration Date: 202302212359
ISSUE DATE / TIME: 202301241812
ISSUE DATE / TIME: 202301242215
ISSUE DATE / TIME: 202301250932
Unit Type and Rh: 5100
Unit Type and Rh: 5100
Unit Type and Rh: 5100

## 2022-01-24 LAB — PREPARE PLATELET PHERESIS: Unit division: 0

## 2022-01-24 LAB — BASIC METABOLIC PANEL
Anion gap: 5 (ref 5–15)
BUN: 9 mg/dL (ref 8–23)
CO2: 26 mmol/L (ref 22–32)
Calcium: 8.4 mg/dL — ABNORMAL LOW (ref 8.9–10.3)
Chloride: 106 mmol/L (ref 98–111)
Creatinine, Ser: 0.69 mg/dL (ref 0.44–1.00)
GFR, Estimated: >60 mL/min (ref >60)
Glucose, Bld: 97 mg/dL (ref 70–99)
Potassium: 3.9 mmol/L (ref 3.5–5.1)
Sodium: 137 mmol/L (ref 135–145)

## 2022-01-24 LAB — SAVE SMEAR(SSMR), FOR PROVIDER SLIDE REVIEW

## 2022-01-24 LAB — BPAM PLATELET PHERESIS
Blood Product Expiration Date: 202301262359
ISSUE DATE / TIME: 202301251205
Unit Type and Rh: 5100

## 2022-01-24 SURGERY — ESOPHAGOGASTRODUODENOSCOPY (EGD) WITH PROPOFOL
Anesthesia: Monitor Anesthesia Care

## 2022-01-24 MED ORDER — SODIUM CHLORIDE 0.9 % IV SOLN: INTRAVENOUS | Status: AC

## 2022-01-24 MED ORDER — PANTOPRAZOLE SODIUM 40 MG PO TBEC
40.0000 mg | DELAYED_RELEASE_TABLET | Freq: Two times a day (BID) | ORAL | Status: DC
  Administered 2022-01-24 – 2022-01-25 (×3): 40 mg via ORAL
  Filled 2022-01-24 (×3): qty 1

## 2022-01-24 MED ORDER — PROPOFOL 10 MG/ML IV BOLUS
INTRAVENOUS | Status: DC | PRN
Start: 2022-01-24 — End: 2022-01-24
  Administered 2022-01-24 (×6): 20 mg via INTRAVENOUS
  Administered 2022-01-24: 10 mg via INTRAVENOUS
  Administered 2022-01-24: 20 mg via INTRAVENOUS
  Administered 2022-01-24: 40 mg via INTRAVENOUS

## 2022-01-24 MED ORDER — LACTATED RINGERS IV SOLN: INTRAVENOUS | Status: DC | PRN

## 2022-01-24 MED ORDER — PHENYLEPHRINE 40 MCG/ML (10ML) SYRINGE FOR IV PUSH (FOR BLOOD PRESSURE SUPPORT)
PREFILLED_SYRINGE | INTRAVENOUS | Status: DC | PRN
  Administered 2022-01-24 (×3): 80 ug via INTRAVENOUS
  Administered 2022-01-24: 120 ug via INTRAVENOUS
  Administered 2022-01-24: 80 ug via INTRAVENOUS

## 2022-01-24 MED ORDER — FOLIC ACID 1 MG PO TABS
2.0000 mg | ORAL_TABLET | Freq: Every day | ORAL | Status: DC
  Administered 2022-01-24 – 2022-01-25 (×2): 2 mg via ORAL
  Filled 2022-01-24 (×2): qty 2

## 2022-01-24 MED ORDER — LIDOCAINE 2% (20 MG/ML) 5 ML SYRINGE
INTRAMUSCULAR | Status: DC | PRN
  Administered 2022-01-24: 20 mg via INTRAVENOUS

## 2022-01-24 MED ORDER — METHYLPREDNISOLONE SODIUM SUCC 125 MG IJ SOLR
80.0000 mg | Freq: Once | INTRAMUSCULAR | Status: AC
  Administered 2022-01-24: 80 mg via INTRAVENOUS
  Filled 2022-01-24: qty 2

## 2022-01-24 MED ORDER — SODIUM CHLORIDE 0.9 % IV SOLN
510.0000 mg | Freq: Once | INTRAVENOUS | Status: AC
  Administered 2022-01-24: 510 mg via INTRAVENOUS
  Filled 2022-01-24: qty 17

## 2022-01-24 MED ORDER — GLUCAGON HCL RDNA (DIAGNOSTIC) 1 MG IJ SOLR
INTRAMUSCULAR | Status: DC | PRN
  Administered 2022-01-24: .5 mg via INTRAVENOUS

## 2022-01-24 SURGICAL SUPPLY — 15 items

## 2022-01-24 NOTE — Progress Notes (Signed)
[facility] Progress Note  CC:   Anemia and black stools  Subjective: She denies having any nausea or vomiting.  No upper or lower abdominal pain.  No bowel movement in the past 24 hours.  Her nurse Jarrett Soho stated the patient was not sure if she wanted to proceed with the upper endoscopy as scheduled later today.  I spoke to the patient in full detail regarding the EGD to evaluate her upper GI tract for source of significant anemia.  She elected to proceed with EGD as planned.   Objective:  Vital signs in last 24 hours: Temp:  [98.7 F (37.1 C)-98.9 F (37.2 C)] 98.7 F (37.1 C) (01/26 0510) Pulse Rate:  [56-66] 64 (01/26 0510) Resp:  [16-18] 16 (01/26 0510) BP: (113-148)/(52-66) 115/63 (01/26 0510) SpO2:  [100 %] 100 % (01/26 0510)   General: 81 year old female alert in no acute distress Heart: Regular rate and rhythm.  Soft systolic murmur. Pulm: Breath sounds clear throughout. Abdomen: Soft, nondistended.  Nontender.  Diffuse dark papular rash throughout the abdomen.  No mass.  No hepatosplenomegaly.  Positive bowel sounds to all 4 quadrants. Extremities:  Without edema. Neurologic:  Alert and  oriented x 4. Grossly normal neurologically. Psych:  Alert and cooperative. Normal mood and affect.  Intake/Output from previous day: 01/25 0701 - 01/26 0700 In: 823.7 [P.O.:240; I.V.:39.7; Blood:544] Out: 2536 [[id number]] Intake/Output this shift: No intake/output data recorded.  Lab Results: Recent Labs    01/23/22 0827 01/23/22 1532 01/24/22 0413  WBC 3.5* 6.3 6.5  HGB 7.0* 8.4* 8.2*  HCT 23.3* 27.1* 26.6*  PLT 48* 83* 76*   BMET Recent Labs    01/22/22 1527 01/23/22 0311 01/24/22 0413  NA 134* 139 137  K 2.9* 4.8 3.9  CL 99 107 106  CO2 26 27 26   GLUCOSE 138* 87 97  BUN 9 5* 9  CREATININE 0.52 0.43* 0.69  CALCIUM 8.7* 8.2* 8.4*   LFT Recent Labs    01/22/22 1527  PROT 7.1  ALBUMIN 3.7  AST 21  ALT 13  ALKPHOS 63  BILITOT 0.7    PT/INR Recent Labs    01/23/22 0311  LABPROT 12.9  INR 1.0   Hepatitis Panel No results for input(s): HEPBSAG, HCVAB, HEPAIGM, HEPBIGM in the last 72 hours.  US Abdomen Complete  Result Date: 01/22/2022 CLINICAL DATA:  Thrombocytopenia EXAM: ABDOMEN ULTRASOUND COMPLETE COMPARISON:  None. FINDINGS: Gallbladder: Small layering stones measuring up to 1.2 cm. No wall thickening or sonographic Murphy sign. Common bile duct: Diameter: Normal caliber, 4 mm Liver: Coarsened, heterogeneous echotexture suggesting fatty infiltration. No focal abnormality. Portal vein is patent on color Doppler imaging with normal direction of blood flow towards the liver. IVC: No abnormality visualized. Pancreas: Not well visualized due to overlying bowel gas. Spleen: Size and appearance within normal limits. Right Kidney: Length: 8.2 cm. 1.8 cm complex cyst in the mid to lower pole. Thin internal septations noted. No hydronephrosis. Normal echotexture. Left Kidney: Length: 9.7 cm. Echogenicity within normal limits. No mass or hydronephrosis visualized. Abdominal aorta: No aneurysm visualized. Other findings: None. IMPRESSION: Cholelithiasis.  No sonographic evidence of acute cholecystitis. Coarsened, increased echotexture throughout the liver suggesting fatty infiltration. 1.8 cm minimally complex cyst in the lower pole of the right kidney. Electronically Signed   By: Rolm Baptise M.D.   On: 01/22/2022 20:01    Assessment / Plan:  47) 81 year old female with a history of IDA admitted to the hospital 01/22/2022 with  profound anemia, melena and near syncope. Admission Hg 4.1. Transfused 3 units of PRBCs 1/24 - 01/23/2022 -> Hg 7.0 -> 8.4. Today Hg today stable at 8.2. No BM over the past 24 hrs. -Proceed with EGD as scheduled later today with Dr. Ardis Hughs. EGD benefits and risks discussed including risk with sedation, risk of bleeding, perforation and infection  -NPO -IV NS @ 75cc/hr -Continue Pantoprazole 40 mg IV twice  daily -Further recommendations to be determined after EGD completed  2) Thrombocytopenia: Platelets 65.  Transfused one pack of platelets on 1/25. Today plt count 76.   3) History of HSV esophagitis and DU on EGD in 11/2019.  History of H. Pylori. On Pantoprazole 40 mg p.o. twice daily at home.  4) Hypokalemia, resolved. K+ 3.9. -KCl management per the hospitalist  5) SARS coronavirus 19 positive on Paxlovid. Respiratory status stable at this time.     Principal Problem:   Acute blood loss anemia Active Problems:   Iron deficiency anemia   Melena   Dementia without behavioral disturbance (HCC)   Hypokalemia   Thrombocytopenia (HCC)   Esophagitis   COVID-19 virus infection   Pancytopenia ([place])     LOS: 2 days   Noralyn Pick  01/24/2022, 11:12AM

## 2022-01-24 NOTE — Progress Notes (Signed)
Progress Note   Patient: Danielle Adkins [id number]:[id number] DOB: December 18, 1941 DOA: 01/22/2022     2 DOS: the patient was seen and examined on 01/24/2022   Brief hospital course: Danielle Adkins is a 81 y.o. F with history HSV esophagitis, H. pylori with bleeding esophageal and gastric ulcers 12/21 and iron deficiency anemia who presents with 1 week melena, lightheadedness, and fatigue.  History collected from the patient's son at the patient has mild dementia and is somewhat fuzzy with details.  Evidently she has been in good health for the last year after her endoscopy last spring showed HSV esophagitis.  Her hemoglobin got up to 12 g/dL.  She was taking iron and pantoprazole fairly regularly.  Then recently son started to notice she had runny black stools.  Then she started to complain of fatigue, lightheadedness so he brought her to the emergency room.  In the ER, hemoglobin 4.2, platelets 65, potassium 2.7, creatinine normal.  Started on PPI and admitted to hospitalists.  Assessment and Plan * Acute blood loss anemia- (present on admission) Melena. Pancytopenia.Associated with thrombocytopenia and leukopenia History of herpes esophagitis last year, confirmed with immunostaining.  Status post acyclovir. Hemoglobin 4.1 on admission.  Improving to 8.4 after transfusion. MCV low. Reticulocyte count normal.  Iron level elevated although this is after PRBC transfusion therefore could be falsely elevated.  B28 level and folic acid level relatively normal. SP 3 PRBC transfusion. Platelet count also significantly low on admission. Initially discussed with hematology recommended no further work-up necessary. Smear actually showing schistocytes which is concerning therefore requested hematology consultation. Appreciate assistance. EGD done.  Hemoglobin remained stable. Continue PPI twice daily for now.  Esophagitis- (present on admission) History of herpes esophagitis last year, confirmed with  immunostaining.  Status post acyclovir. - Consult GI - Check HIV RPR  Melena- (present on admission) - Start PPI - Consult GI  Iron deficiency anemia- (present on admission)    Thrombocytopenia ([place])- (present on admission) This is a new issue.  Interesting.  Would not expect this from GI bleed alone.  Denies alcohol use. - Obtain US abdomen to look at liver and spleen -Trend platelets -Check coags  COVID-19 virus infection- (present on admission) Son reports intermittent coughing for last few days. No fever no chills. Patient denies any chest pain. Ct 20.4. Discussed with son and pharmacy.  Will initiate therapy with Paxlovid.  Renally dosed. Monitor.  Hypokalemia- (present on admission) Corrected.  Monitor.  Dementia without behavioral disturbance ([place])- (present on admission) Does not appear to have any acute delirium or behavioral issues. Recommend outpatient follow-up     Subjective: Seen after EGD.  No acute complaint.  No nausea no vomiting.  Objective Vitals:   01/24/22 1531 01/24/22 1541 01/24/22 1550 01/24/22 1559  BP: (!) 104/44 (!) 100/50 102/67 (!) 115/59  Pulse: (!) 53 (!) 54 61 (!) 57  Resp: 18 19 17 18   Temp: 97.7 F (36.5 C)   98 F (36.7 C)  TempSrc: Temporal   Oral  SpO2: 100% 100% 100% 100%  Weight:      Height:        General: Appear in mild distress, no Rash; Oral Mucosa Clear, moist. no Abnormal Neck Mass Or lumps, Conjunctiva normal  Cardiovascular: S1 and S2 Present, no Murmur, Respiratory: good respiratory effort, Bilateral Air entry present and CTA, no Crackles, no wheezes Abdomen: Bowel Sound present, Soft and no tenderness Extremities: no Pedal edema Neurology: alert and oriented to time, place, and person affect appropriate. no  new focal deficit Gait not checked due to patient safety concerns   Data Reviewed:  I have seen peripheral smear.  CBC shows stable hemoglobin.  Improving platelet counts.  BMP shows normal  electrolytes and serum creatinine. I have consulted hematology after reviewing the smear.  Family Communication: None at bedside  Disposition: Status is: Inpatient  Remains inpatient appropriate because: Monitor for stabilization of hemoglobin.      Author: Berle Mull, MD 01/24/2022 6:30 PM  For on call review [email or web address].

## 2022-01-24 NOTE — Interval H&P Note (Signed)
History and Physical Interval Note:  01/24/2022 2:38 PM  Danielle Adkins  has presented today for surgery, with the diagnosis of anemia.  The various methods of treatment have been discussed with the patient and family. After consideration of risks, benefits and other options for treatment, the patient has consented to  Procedure(s): ESOPHAGOGASTRODUODENOSCOPY (EGD) WITH PROPOFOL (N/A) as a surgical intervention.  The patient's history has been reviewed, patient examined, no change in status, stable for surgery.  I have reviewed the patient's chart and labs.  Questions were answered to the patient's satisfaction.     Milus Banister

## 2022-01-24 NOTE — Op Note (Signed)
[facility] Patient Name: Danielle Adkins Procedure Date: 01/24/2022 MRN: [id number] Attending MD: Milus Banister , MD Date of Birth: 1941-03-24 CSN: [id number] Age: 81 Admit Type: Inpatient Procedure:                Upper GI endoscopy Indications:              Melena, anemia Hb 4.1 at admit Providers:                Milus Banister, MD, Jeanella Cara, RN,                            Cletis Athens, Technician, Glenis Smoker, CRNA Referring MD:              Medicines:                Monitored Anesthesia Care Complications:            No immediate complications. Estimated blood loss:                            None. Estimated Blood Loss:     Estimated blood loss: none. Procedure:                Pre-Anesthesia Assessment:                           - Prior to the procedure, a History and Physical                            was performed, and patient medications and                            allergies were reviewed. The patient's tolerance of                            previous anesthesia was also reviewed. The risks                            and benefits of the procedure and the sedation                            options and risks were discussed with the patient.                            All questions were answered, and informed consent                            was obtained. Prior Anticoagulants: The patient has                            taken no previous anticoagulant or antiplatelet                            agents. ASA Grade Assessment: III - A patient with  severe systemic disease. After reviewing the risks                            and benefits, the patient was deemed in                            satisfactory condition to undergo the procedure.                           After obtaining informed consent, the endoscope was                            passed under direct vision. Throughout the                             procedure, the patient's blood pressure, pulse, and                            oxygen saturations were monitored continuously. The                            [id number]-[id number] ([id number]) Olympus endoscope was introduced                            through the mouth, and advanced to the third part                            of duodenum. The upper GI endoscopy was                            accomplished without difficulty. The patient                            tolerated the procedure well. Scope In: Scope Out: Findings:      LA Grade B reflux related esophagitis      Small hiatal hernia.      Mild, non-spedific pan gastritis.      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 See images. 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 Coagulation for tissue destruction using argon       plasma was successful. The AVM bled briskly during treatment.      The exam was otherwise without abnormality. Impression:               LA Grade B reflux related esophagitis                           Small hiatal hernia.                           Mild, non-spedific pan gastritis.  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 See images. 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                            Coagulation for tissue destruction using argon                            plasma was successful. The  AVM bled briskly during                            treatment. Moderate Sedation:      Not Applicable - Patient had care per Anesthesia. Recommendation:           - Observe in hospital for recurrent bleeding.                           - Cotinue PPI BID (will change to oral).                            Indefinitely.                           - CBC in AM.                           - Await final pathology results. Procedure Code(s):        --- Professional ---                           [phone], Esophagogastroduodenoscopy, flexible,                            transoral; with ablation of tumor(s), polyp(s), or                            other lesion(s) (includes pre- and post-dilation                            and guide wire passage,  when performed) Diagnosis Code(s):        --- Professional ---                           K31.819, Angiodysplasia of stomach and duodenum                            without bleeding                           K92.1, Melena (includes Hematochezia) CPT copyright 2019 American Medical Association. All rights reserved. The codes documented in this report are preliminary and upon coder review may  be revised to meet current compliance requirements. Milus Banister, MD 01/24/2022 3:35:07 PM This report has been signed electronically. Number of Addenda: 0

## 2022-01-24 NOTE — Progress Notes (Signed)
Oncology Discharge Planning Admission Note  [facility] Address: [address], [place], [place] [address] Hours of Operation:  8am - 5pm, Monday - Friday  Clinic Contact Information:  [phone]) [phone]  Oncology Care Team: Medical Oncologist:  Dr. Marinell Blight, NP is aware of this hospital admission dated 01/22/22 and has assessed patient at bedside. The cancer center will follow [facility]th inpatient care to assist with discharge planning as indicated by the oncologist.  We will arrange your follow up care.  Disclaimer:  This [place] note does not imply a formal consult request has been made by the admitting attending for this admission or there will be an inpatient consult completed by oncology.  Please request oncology consults as per standard process as indicated.

## 2022-01-24 NOTE — Transfer of Care (Signed)
Immediate Anesthesia Transfer of Care Note  Patient: Danielle Adkins  Procedure(s) Performed: ESOPHAGOGASTRODUODENOSCOPY (EGD) WITH PROPOFOL BIOPSY HOT HEMOSTASIS (ARGON PLASMA COAGULATION/BICAP)  Patient Location: PACU and Endoscopy Unit  Anesthesia Type:MAC  Level of Consciousness: awake and alert   Airway & Oxygen Therapy: Patient Spontanous Breathing and Patient connected to face mask oxygen  Post-op Assessment: Report given to RN and Post -op Vital signs reviewed and stable  Post vital signs: Reviewed and stable  Last Vitals:  Vitals Value Taken Time  BP    Temp    Pulse    Resp    SpO2      Last Pain:  Vitals:   01/24/22 1452  TempSrc: Tympanic  PainSc:          Complications: No notable events documented.

## 2022-01-24 NOTE — Progress Notes (Signed)
Patient is concerned and unsure about having an EGD done today. GI PA notified.

## 2022-01-24 NOTE — Anesthesia Postprocedure Evaluation (Signed)
Anesthesia Post Note  Patient: Danielle Adkins  Procedure(s) Performed: ESOPHAGOGASTRODUODENOSCOPY (EGD) WITH PROPOFOL BIOPSY HOT HEMOSTASIS (ARGON PLASMA COAGULATION/BICAP)     Patient location during evaluation: PACU Anesthesia Type: MAC Level of consciousness: awake and alert Pain management: pain level controlled Vital Signs Assessment: post-procedure vital signs reviewed and stable Respiratory status: spontaneous breathing, nonlabored ventilation and respiratory function stable Cardiovascular status: stable and blood pressure returned to baseline Anesthetic complications: no   No notable events documented.  Last Vitals:  Vitals:   01/24/22 1541 01/24/22 1550  BP: (!) 100/50 102/67  Pulse: (!) 54 61  Resp: 19 17  Temp:    SpO2: 100% 100%    Last Pain:  Vitals:   01/24/22 1550  TempSrc:   PainSc: 0-No pain                 Audry Pili

## 2022-01-24 NOTE — Anesthesia Procedure Notes (Signed)
Procedure Name: MAC Date/Time: 01/24/2022 2:51 PM Performed by: Cynda Familia, CRNA Pre-anesthesia Checklist: Patient identified, Emergency Drugs available, Suction available, Patient being monitored and Timeout performed Patient Re-evaluated:Patient Re-evaluated prior to induction Oxygen Delivery Method: Simple face mask Placement Confirmation: positive ETCO2 and breath sounds checked- equal and bilateral Dental Injury: Teeth and Oropharynx as per pre-operative assessment

## 2022-01-24 NOTE — Progress Notes (Signed)
Initial Nutrition Assessment  INTERVENTION:   -Ensure Plus High Protein po BID, each supplement provides 350 kcal and 20 grams of protein.  -Multivitamin with minerals daily  NUTRITION DIAGNOSIS:   Increased nutrient needs related to acute illness as evidenced by estimated needs.  GOAL:   Patient will meet greater than or equal to 90% of their needs  MONITOR:   Supplement acceptance, PO intake, Labs, Weight trends, I & O's  REASON FOR ASSESSMENT:   Malnutrition Screening Tool    ASSESSMENT:   81 y.o. F with history HSV esophagitis, H. pylori with bleeding esophageal and gastric ulcers 12/21 and iron deficiency anemia who presents with 1 week melena, lightheadedness, and fatigue.  Patient currently unavailable, in endoscopy for EGD.  Per chart review, pt has reported having  a good appetite. Ate 100% of dinner last night. Now NPO for procedure.  Will order Ensure and daily MVI given increased needs from acute illness and currently COVID-19+.  Per weight records, pt's weight has been recorded the same since 2021. May need updated weight.  Medications reviewed.  Labs reviewed.  NUTRITION - FOCUSED PHYSICAL EXAM:  Unable to complete   Diet Order:   Diet Order             Diet NPO time specified  Diet effective midnight                   EDUCATION NEEDS:   No education needs have been identified at this time  Skin:  Skin Assessment: Reviewed RN Assessment  Last BM:  PTA  Height:   Ht Readings from Last 1 Encounters:  01/24/22 5\' 5"  (1.651 m)    Weight:   Wt Readings from Last 1 Encounters:  01/24/22 59 kg    BMI:  Body mass index is 21.64 kg/m.  Estimated Nutritional Needs:   Kcal:  1500-1700  Protein:  70-85g  Fluid:  1.7L/day  Clayton Bibles, MS, RD, LDN Inpatient Clinical Dietitian Contact information available via Amion

## 2022-01-24 NOTE — Progress Notes (Signed)
Elmyra Ricks at [facility] Hematology lab notified of add on peripheral blood smear and will have available for review.

## 2022-01-24 NOTE — Consult Note (Addendum)
[facility]  [phone] [phone]    [place]  Referring MD:  Dr. Berle Mull  Reason for Referral: Anemia and thrombocytopenia  HPI: Danielle Adkins is an 81 year old female with a past medical history significant for HSV esophagitis, H. pylori with bleeding esophageal and gastric ulcers in December 2021, and iron deficiency anemia.  She presented to the emergency department with melena and weakness.  She was also experiencing lightheadedness and fatigue.  The patient has some baseline mild dementia and history was obtained from the patient's son.  Reportedly, her hemoglobin has been as high as 12.  She has been on pantoprazole and oral iron.  She recently developed runny, black stools.  She then started to complain of fatigue and lightheadedness and was brought to the emergency department.  Admission CBC showed a hemoglobin of 4.1, MCV 63, platelet count 65,000.  Her ferritin was 9 and the remainder of her iron studies are normal.  Vitamin B12 level was low normal at 356.  Absolute reticulocyte count not elevated.  Fibrinogen level was normal at 310.  Technologist review of the peripheral blood smear showed elliptocytes, target cells, polychromasia, and teardrop cells.  There is also mention of schistocytes.  She was noted to be COVID-positive on admission.  This admission, she has received a total of 3 units PRBCs.  She also received 1 unit of platelets on 01/23/2022.  She has been evaluated by GI who is planning for EGD later today.  Her most recent CBC available to me prior to this admission was performed on 03/23/2020.  At that time, her hemoglobin was 11.9, MCV was normal, and platelet count was normal at 199,000.  I met with the patient in her hospital room today.  No family at the bedside.  She is mildly confused but is able to provide some history.  She tells me that she is no longer having significant fatigue or dizziness.  Overall,  she feels better after receiving a blood transfusion.  She currently denies bleeding.  States that her black stools have resolved.  Discussed with nursing who confirmed this.  She is not having any fevers, chills, chest pain, shortness of breath, cough, abdominal pain, nausea, vomiting.  She tells me that she was previously taking pantoprazole and iron at home.  She states that she ran out of pantoprazole about 1 week prior to admission.  She tells me that she was taking oral iron but has not taken it in 1 to 2 years.  States that she was told that she no longer needed it.  The patient is widowed.  She lives with her son.  She has 3 sons in total.  Denies history of alcohol and tobacco use.  Family history negative for anemia and blood disorders.  Hematology was asked to see the patient for recommendations regarding her anemia and thrombocytopenia.  Past Medical History:  Diagnosis Date   Anemia    IDA   Arthritis    Blood transfusion without reported diagnosis    GERD (gastroesophageal reflux disease)    H pylori ulcer    Herpes simplex esophagitis    PUD (peptic ulcer disease)   :  Past Surgical History:  Procedure Laterality Date   BIOPSY  12/02/2019   Procedure: BIOPSY;  Surgeon: Lavena Bullion, DO;  Location: [facility] ENDOSCOPY;  Service: Gastroenterology;;   COLONOSCOPY WITH PROPOFOL N/A 12/02/2019   Procedure: COLONOSCOPY WITH PROPOFOL;  Surgeon: Lavena Bullion, DO;  Location: [facility]  ENDOSCOPY;  Service: Gastroenterology;  Laterality: N/A;   ESOPHAGOGASTRODUODENOSCOPY (EGD) WITH PROPOFOL N/A 12/02/2019   Procedure: ESOPHAGOGASTRODUODENOSCOPY (EGD) WITH PROPOFOL;  Surgeon: Lavena Bullion, DO;  Location: [facility] ENDOSCOPY;  Service: Gastroenterology;  Laterality: N/A;   POLYPECTOMY  12/02/2019   Procedure: POLYPECTOMY;  Surgeon: Lavena Bullion, DO;  Location: [facility] ENDOSCOPY;  Service: Gastroenterology;;  :   CURRENT MEDS: Current Facility-Administered Medications  Medication Dose Route  Frequency Provider Last Rate Last Admin   0.9 %  sodium chloride infusion   Intravenous Continuous Zehr, Jessica D, PA-C 20 mL/hr at 01/23/22 1501 New Bag at 01/23/22 1501   acetaminophen (TYLENOL) tablet 650 mg  650 mg Oral Q6H PRN Danford, Suann Larry, MD       Or   acetaminophen (TYLENOL) suppository 650 mg  650 mg Rectal Q6H PRN Danford, Suann Larry, MD       influenza vaccine adjuvanted (FLUAD) injection 0.5 mL  0.5 mL Intramuscular Tomorrow-1000 Danford, Suann Larry, MD       nirmatrelvir/ritonavir EUA (PAXLOVID) 3 tablet  3 tablet Oral BID Adrian Saran, [facility]   3 tablet at 01/23/22 2353   ondansetron (ZOFRAN) tablet 4 mg  4 mg Oral Q6H PRN Danford, Suann Larry, MD       Or   ondansetron (ZOFRAN) injection 4 mg  4 mg Intravenous Q6H PRN Danford, Suann Larry, MD       pantoprazole (PROTONIX) injection 40 mg  40 mg Intravenous Q12H Danford, Suann Larry, MD   40 mg at 01/23/22 2352    No Known Allergies:   Family History  Problem Relation Age of Onset   Colon cancer Neg Hx    Esophageal cancer Neg Hx    Rectal cancer Neg Hx    Stomach cancer Neg Hx   :   Social History   Socioeconomic History   Marital status: Widowed    Spouse name: Not on file   Number of children: Not on file   Years of education: Not on file   Highest education level: Not on file  Occupational History   Not on file  Tobacco Use   Smoking status: Never   Smokeless tobacco: Never  Vaping Use   Vaping Use: Never used  Substance and Sexual Activity   Alcohol use: Not Currently   Drug use: Never   Sexual activity: Not on file  Other Topics Concern   Not on file  Social History Narrative   Not on file   Social Determinants of Health   Financial Resource Strain: Not on file  Food Insecurity: Not on file  Transportation Needs: Not on file  Physical Activity: Not on file  Stress: Not on file  Social Connections: Not on file  Intimate Partner Violence: Not on file  :  REVIEW OF  SYSTEMS:  A comprehensive 14 point review of systems was negative except as noted in the HPI.    Exam: Patient Vitals for the past 24 hrs:  BP Temp Temp src Pulse Resp SpO2  01/24/22 0510 115/63 98.7 F (37.1 C) Oral 64 16 100 %  01/23/22 2131 (!) 145/66 98.7 F (37.1 C) Oral 66 16 100 %  01/23/22 1419 113/64 98.8 F (37.1 C) Oral 62 18 100 %  01/23/22 1223 (!) 148/52 98.9 F (37.2 C) Oral 66 18 100 %  01/23/22 1200 137/63 98.9 F (37.2 C) Oral (!) 56 18 100 %  01/23/22 0954 (!) 128/59 97.9 F (36.6 C) Oral 60 18 100 %  01/23/22 0930 118/62 98.3 F (36.8 C) Oral 60 15 100 %    Physical Exam Vitals reviewed.  Constitutional:      General: She is not in acute distress. HENT:     Head: Normocephalic.     Mouth/Throat:     Pharynx: No oropharyngeal exudate or posterior oropharyngeal erythema.  Eyes:     General: No scleral icterus.    Conjunctiva/sclera: Conjunctivae normal.  Cardiovascular:     Rate and Rhythm: Normal rate and regular rhythm.  Pulmonary:     Effort: Pulmonary effort is normal.     Breath sounds: Normal breath sounds.  Abdominal:     General: Abdomen is flat. Bowel sounds are normal.     Palpations: Abdomen is soft.  Skin:    General: Skin is warm and dry.  Neurological:     Mental Status: She is alert. Mental status is at baseline.  Psychiatric:        Mood and Affect: Mood normal.        Behavior: Behavior normal.   LABS:  Lab Results  Component Value Date   WBC 6.5 01/24/2022   HGB 8.2 (L) 01/24/2022   HCT 26.6 (L) 01/24/2022   PLT 76 (L) 01/24/2022   GLUCOSE 97 01/24/2022   ALT 13 01/22/2022   AST 21 01/22/2022   NA 137 01/24/2022   K 3.9 01/24/2022   CL 106 01/24/2022   CREATININE 0.69 01/24/2022   BUN 9 01/24/2022   CO2 26 01/24/2022   INR 1.0 01/23/2022    US Abdomen Complete  Result Date: 01/22/2022 CLINICAL DATA:  Thrombocytopenia EXAM: ABDOMEN ULTRASOUND COMPLETE COMPARISON:  None. FINDINGS: Gallbladder: Small layering  stones measuring up to 1.2 cm. No wall thickening or sonographic Murphy sign. Common bile duct: Diameter: Normal caliber, 4 mm Liver: Coarsened, heterogeneous echotexture suggesting fatty infiltration. No focal abnormality. Portal vein is patent on color Doppler imaging with normal direction of blood flow towards the liver. IVC: No abnormality visualized. Pancreas: Not well visualized due to overlying bowel gas. Spleen: Size and appearance within normal limits. Right Kidney: Length: 8.2 cm. 1.8 cm complex cyst in the mid to lower pole. Thin internal septations noted. No hydronephrosis. Normal echotexture. Left Kidney: Length: 9.7 cm. Echogenicity within normal limits. No mass or hydronephrosis visualized. Abdominal aorta: No aneurysm visualized. Other findings: None. IMPRESSION: Cholelithiasis.  No sonographic evidence of acute cholecystitis. Coarsened, increased echotexture throughout the liver suggesting fatty infiltration. 1.8 cm minimally complex cyst in the lower pole of the right kidney. Electronically Signed   By: Rolm Baptise M.D.   On: 01/22/2022 20:01     ASSESSMENT AND PLAN:  1.  Microcytic anemia consistent with iron deficiency, acute blood loss  2.  Thrombocytopenia 3.  History of HSV esophagitis 4.  Melena, improved 5.  COVID-19 viral infection 6.  Dementia without behavioral disturbance  -The patient was anemic on admission likely due to acute blood loss.  She has received 3 units PRBC so far this admission with improvement of her hemoglobin.  Her ferritin was low at 9.  Agree with GI work-up including EGD.  Would recommend dose of IV iron this admission and then for her to resume oral iron. -Thrombocytopenia is a new issue.  Could be consumptive due to recent bleed.  Thrombocytopenia also seen with COVID-19 infections.  However, patient asymptomatic from COVID-19 so I think this is less likely.  Will obtain a peripheral blood smear for review.  Further recommendations per Dr.  Marin Olp. -  GI following given history of HSV esophagitis/melena.  She has been restarted on pantoprazole.  EGD planned for later today. -On Paxlovid per primary team for COVID-19.  Thank you for this referral.  Mikey Bussing, DNP, AGPCNP-BC, AOCNP    ADDENDUM: I saw and examined Danielle Adkins.  She is incredibly charming.  She may have a [place] bit of memory issues.  She has the thrombocytopenia.  2 years ago, her platelet count was normal.  As such, I am not sure what would have happened between then and now.  She has COVID.  I looked at her blood smear.  She clearly has GI bleeding.  I suspect that she also has a hemoglobinopathy.  On the blood smear, she has target cells.  She has microcytic and hypochromic red blood cells.  There is a rare nucleated red blood cells.  She has polychromasia.  I do not see any immature myeloid or lymphoid cells.  There were no blasts.  There were no hypersegmented polys.  I really was not impressed with any schistocytes.  She did have some spherocytes.  She has some ovalocytes.  Her platelets were few.  She had good regulation of her platelets.  She had a couple large platelets.  It is hard to say why she has the thrombocytopenia.  She is not on any medications that would do this.  I suppose just the COVID might be a factor.  Given her age, there is always a consideration of myelodysplasia.  She had an ultrasound that was done 2 days ago.  There was no splenomegaly.  She may have had hepatic steatosis.  She is clearly iron deficient.  She needs IV iron.  No interesting to see what the hemoglobin electrophoresis will show.  It is possible that she may have alpha thalassemia.  She said that she does not think that she has sickle cell.  By the target cells, I would think alpha thalassemia is likely.  As such, I will put her on some folic acid.  I really would like to try to avoid having to do a bone marrow biopsy on her.  However, this might be something that  is needed.  I probably would try to wait until she clears up his COVID.  It be nice to try to evaluate her after she has gotten over this acute episode.  Again I do not see anything on the blood smear that looks like a microangiopathic hemolytic process.  Reticulocyte count is only 0.8%.  This is incredibly low and it clearly is not indicative of hemolysis.  I saw the upper endoscopy report.  There was some esophagitis.  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  There was a polyp I think that was biopsied.  Right now, we will just follow along.  I think IV iron will help.  I think that oral folic acid will help.  Again the platelets may be low from the COVID.  The platelets may be low from having some consumption from bleeding.  I do appreciate having to see Danielle Adkins.  Again she is quite nice.  I think she does have [place] bit of dementia but I really do not think this is a problem for her.  Lattie Haw, MD  Jeneen Rinks 1:5

## 2022-01-24 NOTE — Anesthesia Preprocedure Evaluation (Addendum)
Anesthesia Evaluation  Patient identified by MRN, date of birth, ID band Patient awake    Reviewed: Allergy & Precautions, NPO status , Patient's Chart, lab work & pertinent test results  Airway Mallampati: III  TM Distance: >3 FB Neck ROM: Full    Dental  (+) Missing, Poor Dentition, Dental Advisory Given,    Pulmonary neg pulmonary ROS,  COVID positive   Pulmonary exam normal breath sounds clear to auscultation       Cardiovascular negative cardio ROS Normal cardiovascular exam Rhythm:Regular Rate:Normal     Neuro/Psych PSYCHIATRIC DISORDERS Dementia negative neurological ROS     GI/Hepatic Neg liver ROS, PUD, GERD  ,  Endo/Other  negative endocrine ROS  Renal/GU negative Renal ROS  negative genitourinary   Musculoskeletal  (+) Arthritis ,   Abdominal   Peds  Hematology  (+) Blood dyscrasia, anemia , Lab Results      Component                Value               Date                      WBC                      6.5                 01/24/2022                HGB                      8.2 (L)             01/24/2022                HCT                      26.6 (L)            01/24/2022                MCV                      78.2 (L)            01/24/2022                PLT                      76 (L)              01/24/2022              Anesthesia Other Findings 81 y.o. F with history HSV esophagitis, H. pylori with bleeding esophageal and gastric ulcers 12/21 and iron deficiency anemia who presents with 1 week melena, lightheadedness, and fatigue   Reproductive/Obstetrics                            Anesthesia Physical Anesthesia Plan  ASA: 3  Anesthesia Plan: MAC   Post-op Pain Management:    Induction: Intravenous  PONV Risk Score and Plan: Propofol infusion and Treatment may vary due to age or medical condition  Airway Management Planned: Natural Airway  Additional Equipment:    Intra-op Plan:   Post-operative Plan:   Informed Consent: I have reviewed the patients History and Physical,  chart, labs and discussed the procedure including the risks, benefits and alternatives for the proposed anesthesia with the patient or authorized representative who has indicated his/her understanding and acceptance.     Dental advisory given  Plan Discussed with: CRNA  Anesthesia Plan Comments:         Anesthesia Quick Evaluation

## 2022-01-25 ENCOUNTER — Encounter (HOSPITAL_COMMUNITY): Payer: Self-pay | Admitting: Gastroenterology

## 2022-01-25 DIAGNOSIS — D62 Acute posthemorrhagic anemia: Secondary | ICD-10-CM | POA: Diagnosis not present

## 2022-01-25 LAB — CBC
HCT: 30.3 % — ABNORMAL LOW (ref 36.0–46.0)
Hemoglobin: 9.2 g/dL — ABNORMAL LOW (ref 12.0–15.0)
MCH: 24 pg — ABNORMAL LOW (ref 26.0–34.0)
MCHC: 30.4 g/dL (ref 30.0–36.0)
MCV: 78.9 fL — ABNORMAL LOW (ref 80.0–100.0)
Platelets: 114 10*3/uL — ABNORMAL LOW (ref 150–400)
RBC: 3.84 MIL/uL — ABNORMAL LOW (ref 3.87–5.11)
WBC: 4.3 10*3/uL (ref 4.0–10.5)
nRBC: 2.3 % — ABNORMAL HIGH (ref 0.0–0.2)

## 2022-01-25 MED ORDER — ADULT MULTIVITAMIN W/MINERALS CH
1.0000 | ORAL_TABLET | Freq: Every day | ORAL | Status: DC
  Administered 2022-01-25: 1 via ORAL
  Filled 2022-01-25: qty 1

## 2022-01-25 MED ORDER — ADULT MULTIVITAMIN W/MINERALS CH: 1.0000 | ORAL_TABLET | Freq: Every day | ORAL | 0 refills | Status: AC

## 2022-01-25 MED ORDER — PANTOPRAZOLE SODIUM 40 MG PO TBEC: 40.0000 mg | DELAYED_RELEASE_TABLET | Freq: Two times a day (BID) | ORAL | 0 refills | Status: AC

## 2022-01-25 MED ORDER — ENSURE ENLIVE PO LIQD
237.0000 mL | Freq: Two times a day (BID) | ORAL | Status: DC
  Administered 2022-01-25 (×2): 237 mL via ORAL

## 2022-01-25 MED ORDER — NIRMATRELVIR/RITONAVIR (PAXLOVID)TABLET: 3.0000 | ORAL_TABLET | Freq: Two times a day (BID) | ORAL | 0 refills | Status: AC

## 2022-01-25 MED ORDER — ENSURE ENLIVE PO LIQD: 237.0000 mL | Freq: Two times a day (BID) | ORAL | 0 refills | Status: AC

## 2022-01-25 MED ORDER — FERROUS SULFATE 325 (65 FE) MG PO TBEC: 325.0000 mg | DELAYED_RELEASE_TABLET | Freq: Every day | ORAL | 3 refills | Status: DC

## 2022-01-25 MED ORDER — FOLIC ACID 1 MG PO TABS: 2.0000 mg | ORAL_TABLET | Freq: Every day | ORAL | 0 refills | Status: DC
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
## 2022-01-25 NOTE — Care Management Important Message (Signed)
Important Message  Patient Details IM Letter placed in Patients room. Name: Danielle Adkins MRN: [id number] Date of Birth: 1941-08-18   Medicare Important Message Given:  Yes     Kerin Salen 01/25/2022, 11:41 AM

## 2022-01-25 NOTE — Progress Notes (Signed)
Thankfully, everything seems to be improving a [place] bit.  Her hemoglobin is 9.2.  Her platelet count is 114,000.  She should have gotten IV iron.  I know her iron was quite low.  Her iron saturation was only 9%.  Of note, her reticulocyte count is incredibly low.  I had to believe that she is going to have an inadequate erythropoietin level.  Thankfully, her platelet count does seem to be improving slowly but surely.    I will I do not think that there is going to be any issues with respect to a malignancy.  She certainly could have an element of myelodysplasia but I think this would require a bone marrow biopsy to be done.  I really do not think we have to go down that road right now.  I think that as long as her blood counts are recovering, I think we are okay.  I do have her on folic acid right now.  I do think this will help.  She is still getting over the [place].  We will just have to be patient in my opinion.  I do appreciate the incredible care she is getting from the wonderful staff up on [address].  Lattie Haw, MD'  2 Corinthians 12:9

## 2022-01-25 NOTE — Discharge Instructions (Signed)

## 2022-01-25 NOTE — Progress Notes (Signed)
Patient and son was given discharge instructions, and all questions were answered. Patient was stable for discharge and was taken to the main exit by wheelchair.

## 2022-01-27 LAB — ERYTHROPOIETIN: Erythropoietin: 72.3 m[IU]/mL — ABNORMAL HIGH (ref 2.6–18.5)

## 2022-01-28 LAB — SURGICAL PATHOLOGY

## 2022-01-28 NOTE — Assessment & Plan Note (Signed)
History of herpes esophagitis last year, confirmed with immunostaining.  Status post acyclovir.

## 2022-01-28 NOTE — Assessment & Plan Note (Signed)
This is a new issue.  SP 1 platelet transfusion. Etiology still not clear although suspected secondary to dilution from PRBC transfusion as well as loss along with GI bleed. 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 Hematology was consulted. Patient was managed conservatively without any intervention.  Possibility of myelodysplastic syndrome cannot be ruled out although no indication for further work-up.  Mention possibility of ITP in the setting of COVID-19 cannot be ruled out but platelet count improving on its own.

## 2022-01-28 NOTE — Assessment & Plan Note (Deleted)
-   Start PPI - Consult GI

## 2022-01-28 NOTE — Discharge Summary (Signed)
Physician Discharge Summary   Patient: Danielle Adkins MRN: [id number] DOB: [birth year]  Admit date:     01/22/2022  Discharge date: 01/25/2022  Discharge Physician: Berle Mull   PCP: Patient, No Pcp Per (Inactive)   Recommendations at discharge:   Follow-up with PCP in 1 week. Follow-up with GI as recommended.  Discharge Diagnoses Principal Problem:   Acute blood loss anemia Active Problems:   Iron deficiency anemia   Melena   Esophagitis   Thrombocytopenia (HCC)   Pancytopenia (HCC)   COVID-19 virus infection   Dementia without behavioral disturbance (HCC)   Hypokalemia  Resolved Problems:   * No resolved hospital problems. [facility]   Danielle Adkins is a 81 y.o. F with history HSV esophagitis, H. pylori with bleeding esophageal and gastric ulcers 12/21 and iron deficiency anemia who presents with 1 week melena, lightheadedness, and fatigue.  History collected from the patient's son at the patient has mild dementia and is somewhat fuzzy with details.  Evidently she has been in good health for the last year after her endoscopy last spring showed HSV esophagitis.  Her hemoglobin got up to 12 g/dL.  She was taking iron and pantoprazole fairly regularly.  Then recently son started to notice she had runny black stools.  Then she started to complain of fatigue, lightheadedness so he brought her to the emergency room.  In the ER, hemoglobin 4.2, platelets 65, potassium 2.7, creatinine normal.  Started on PPI and admitted to hospitalists.  * Acute blood loss anemia- (present on admission) Melena. Pancytopenia.Associated with thrombocytopenia and leukopenia History of herpes esophagitis last year, confirmed with immunostaining.  Status post acyclovir. Hemoglobin 4.1 on admission.  Improving to 8.4 after transfusion. MCV low. Reticulocyte count normal.  Iron level elevated although this is after PRBC transfusion therefore could be falsely elevated.  V03 level and folic acid  level relatively normal. SP 3 PRBC transfusion. Platelet count also significantly low on admission. Initially discussed with hematology recommended no further work-up necessary. Smear actually showing schistocytes which is concerning therefore requested hematology consultation. Appreciate assistance. EGD done.  Hemoglobin remained stable. Continue PPI twice daily for now.  Esophagitis- (present on admission) History of herpes esophagitis last year, confirmed with immunostaining.  Status post acyclovir.  Thrombocytopenia ([place])- (present on admission) This is a new issue.  SP 1 platelet transfusion. Etiology still not clear although suspected secondary to dilution from PRBC transfusion as well as loss along with GI bleed. 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 Hematology was consulted. Patient was managed conservatively without any intervention.  Possibility of myelodysplastic syndrome cannot be ruled out although no indication for further work-up.  Mention possibility of ITP in the setting of COVID-19 cannot be ruled out but platelet count improving on its own.  COVID-19 virus infection- (present on admission) Son reports intermittent coughing for last few days. No fever no chills. Patient denies any chest pain. Ct 20.4. Discussed with son and pharmacy.  Will initiate therapy with Paxlovid.  Renally dosed. Monitor.  Hypokalemia- (present on admission) Corrected.  Monitor.  Dementia without behavioral disturbance ([place])- (present on admission) Does not appear to have any acute delirium or behavioral issues. Recommend outpatient follow-up      Consultants: Gastroenterology, Oncology Procedures performed: EGD PRBC and platelet transfusion. Disposition: Home Diet recommendation: Regular diet  DISCHARGE MEDICATION: Allergies as of 01/25/2022   No Known Allergies      Medication List     STOP taking these medications    Theraflu Cold &  Cough 10-19-19 MG Pack Generic drug:  Phenylephrine-Pheniramine-DM       TAKE these medications    COUGH/SORE THROAT LOZENGES MT Use as directed 1 lozenge in the mouth or throat as needed (for a sore throat/irritation).   feeding supplement Liqd Take 237 mLs by mouth 2 (two) times daily between meals.   ferrous sulfate 325 (65 FE) MG EC tablet Take 1 tablet (325 mg total) by mouth daily with breakfast. What changed: when to take this   folic acid 1 MG tablet Commonly known as: FOLVITE Take 2 tablets (2 mg total) by mouth daily. What changed: how much to take   multivitamin with minerals Tabs tablet Take 1 tablet by mouth daily.   nirmatrelvir/ritonavir EUA 20 x 150 MG & 10 x 100MG  Tabs Commonly known as: PAXLOVID Take 3 tablets by mouth 2 (two) times daily for 3 days. Take nirmatrelvir (150 mg) two tablets twice daily for 5 days and ritonavir (100 mg) one tablet twice daily for 5 days.   pantoprazole 40 MG tablet Commonly known as: PROTONIX Take 1 tablet (40 mg total) by mouth 2 (two) times daily before a meal.        Follow-up Information     PCP. Schedule an appointment as soon as possible for a visit in 1 week(s).   Why: with CBC and BMP                Discharge Exam: Filed Weights   01/22/22 1345 01/24/22 1448  Weight: 59 kg 59 kg   Vitals:   01/24/22 1559 01/24/22 2152 01/25/22 0540 01/25/22 1223  BP: (!) 115/59 119/61 140/63 125/73  Pulse: (!) 57 64 (!) 57 73  Resp: 18 16 16 16   Temp: 98 F (36.7 C) 98.8 F (37.1 C) 98.5 F (36.9 C) 98.8 F (37.1 C)  TempSrc: Oral Oral Oral Oral  SpO2: 100% 100% 100% 100%  Weight:      Height:        General: Appear in mild distress, no Rash; Oral Mucosa Clear, moist. no Abnormal Neck Mass Or lumps, Conjunctiva normal  Cardiovascular: S1 and S2 Present, no Murmur, Respiratory: good respiratory effort, Bilateral Air entry present and CTA, no Crackles, no wheezes Abdomen: Bowel Sound present, Soft and no tenderness Extremities: no Pedal  edema Neurology: alert and oriented to time, place, and person affect appropriate. no new focal deficit Gait not checked due to patient safety concerns   Condition at discharge: good  The results of significant diagnostics from this hospitalization (including imaging, microbiology, ancillary and laboratory) are listed below for reference.   Imaging Studies: US Abdomen Complete  Result Date: 01/22/2022 CLINICAL DATA:  Thrombocytopenia EXAM: ABDOMEN ULTRASOUND COMPLETE COMPARISON:  None. FINDINGS: Gallbladder: Small layering stones measuring up to 1.2 cm. No wall thickening or sonographic Murphy sign. Common bile duct: Diameter: Normal caliber, 4 mm Liver: Coarsened, heterogeneous echotexture suggesting fatty infiltration. No focal abnormality. Portal vein is patent on color Doppler imaging with normal direction of blood flow towards the liver. IVC: No abnormality visualized. Pancreas: Not well visualized due to overlying bowel gas. Spleen: Size and appearance within normal limits. Right Kidney: Length: 8.2 cm. 1.8 cm complex cyst in the mid to lower pole. Thin internal septations noted. No hydronephrosis. Normal echotexture. Left Kidney: Length: 9.7 cm. Echogenicity within normal limits. No mass or hydronephrosis visualized. Abdominal aorta: No aneurysm visualized. Other findings: None. IMPRESSION: Cholelithiasis.  No sonographic evidence of acute cholecystitis. Coarsened, increased echotexture throughout the liver suggesting  fatty infiltration. 1.8 cm minimally complex cyst in the lower pole of the right kidney. Electronically Signed   By: Rolm Baptise M.D.   On: 01/22/2022 20:01    Microbiology: Results for orders placed or performed during the hospital encounter of 01/22/22  Resp Panel by RT-PCR (Flu A&B, Covid) Nasopharyngeal Swab     Status: Abnormal   Collection Time: 01/22/22  7:16 PM   Specimen: Nasopharyngeal Swab; Nasopharyngeal(NP) swabs in vial transport medium  Result Value Ref Range  Status   SARS Coronavirus 2 by RT PCR POSITIVE (A) NEGATIVE Final    Comment: (NOTE) SARS-CoV-2 target nucleic acids are DETECTED.  The SARS-CoV-2 RNA is generally detectable in upper respiratory specimens during the acute phase of infection. Positive results are indicative of the presence of the identified virus, but do not rule out bacterial infection or co-infection with other pathogens not detected by the test. Clinical correlation with patient history and other diagnostic information is necessary to determine patient infection status. The expected result is Negative.  Fact Sheet for Patients: EntrepreneurPulse.com.au  Fact Sheet for Healthcare Providers: IncredibleEmployment.be  This test is not yet approved or cleared by the [place] FDA and  has been authorized for detection and/or diagnosis of SARS-CoV-2 by FDA under an Emergency Use Authorization (EUA).  This EUA will remain in effect (meaning this test can be used) for the duration of  the COVID-19 declaration under Section 564(b)(1) of the A ct, 21 U.S.C. section 360bbb-3(b)(1), unless the authorization is terminated or revoked sooner.     Influenza A by PCR NEGATIVE NEGATIVE Final   Influenza B by PCR NEGATIVE NEGATIVE Final    Comment: (NOTE) The Xpert Xpress SARS-CoV-2/FLU/RSV plus assay is intended as an aid in the diagnosis of influenza from Nasopharyngeal swab specimens and should not be used as a sole basis for treatment. Nasal washings and aspirates are unacceptable for Xpert Xpress SARS-CoV-2/FLU/RSV testing.  Fact Sheet for Patients: EntrepreneurPulse.com.au  Fact Sheet for Healthcare Providers: IncredibleEmployment.be  This test is not yet approved or cleared by the [place] FDA and has been authorized for detection and/or diagnosis of SARS-CoV-2 by FDA under an Emergency Use Authorization (EUA). This EUA will remain in  effect (meaning this test can be used) for the duration of the COVID-19 declaration under Section 564(b)(1) of the Act, 21 U.S.C. section 360bbb-3(b)(1), unless the authorization is terminated or revoked.  Performed at [facility], [place] [address], [place], [place] [address]     Labs: CBC: Recent Labs  Lab 01/23/22 0827 01/23/22 1532 01/24/22 0413 01/24/22 1702 01/25/22 0441  WBC 3.5* 6.3 6.5 14.2* 4.3  NEUTROABS 2.3 5.0  --  12.1*  --   HGB 7.0* 8.4* 8.2* 8.9* 9.2*  HCT 23.3* 27.1* 26.6* 29.1* 30.3*  MCV 74.7* 77.0* 78.2* 78.2* 78.9*  PLT 48* 83* 76* 97* 790*   Basic Metabolic Panel: Recent Labs  Lab 01/22/22 1527 01/23/22 0311 01/24/22 0413  NA 134* 139 137  K 2.9* 4.8 3.9  CL 99 107 106  CO2 26 27 26   GLUCOSE 138* 87 97  BUN 9 5* 9  CREATININE 0.52 0.43* 0.69  CALCIUM 8.7* 8.2* 8.4*  MG  --  1.8  --    Liver Function Tests: Recent Labs  Lab 01/22/22 1527  AST 21  ALT 13  ALKPHOS 63  BILITOT 0.7  PROT 7.1  ALBUMIN 3.7   CBG: No results for input(s): GLUCAP in the last 168 hours.  Discharge time  spent: greater than 30 minutes.  Signed: Berle Mull, MD [facility]

## 2022-01-29 LAB — HGB FRACTIONATION CASCADE
Hgb A2: 2.3 % (ref 1.8–3.2)
Hgb A: 97.7 % (ref 96.4–98.8)
Hgb F: 0 % (ref 0.0–2.0)
Hgb S: 0 %

## 2022-01-30 ENCOUNTER — Encounter (HOSPITAL_COMMUNITY): Payer: Self-pay | Admitting: *Deleted

## 2022-01-30 NOTE — Progress Notes (Unsigned)
Danielle Adkins and son Danielle Adkins were contacted by telephone to verify understanding of discharge instructions status post their most recent discharge from the hospital on the date:  01/25/22.  Inpatient discharge AVS was re-reviewed with patient, along with cancer center appointments.  Verification of understanding for oncology specific follow-up was validated using the Teach Back method.    Transportation to appointments were confirmed for the patient as being self/caregiver.  Danielle Adkins questions were addressed to their satisfaction upon completion of this post discharge follow-up call for outpatient oncology.

## 2022-02-01 DIAGNOSIS — D500 Iron deficiency anemia secondary to blood loss (chronic): Secondary | ICD-10-CM | POA: Diagnosis not present

## 2022-02-01 DIAGNOSIS — D61818 Other pancytopenia: Secondary | ICD-10-CM | POA: Diagnosis not present

## 2022-02-01 DIAGNOSIS — R5383 Other fatigue: Secondary | ICD-10-CM | POA: Diagnosis not present

## 2022-02-22 ENCOUNTER — Other Ambulatory Visit: Payer: Self-pay

## 2022-02-22 ENCOUNTER — Inpatient Hospital Stay (HOSPITAL_BASED_OUTPATIENT_CLINIC_OR_DEPARTMENT_OTHER): Payer: Medicare HMO | Admitting: Family

## 2022-02-22 ENCOUNTER — Inpatient Hospital Stay: Payer: Medicare HMO | Attending: Hematology & Oncology

## 2022-02-22 ENCOUNTER — Encounter: Payer: Self-pay | Admitting: Family

## 2022-02-22 ENCOUNTER — Telehealth: Payer: Self-pay | Admitting: *Deleted

## 2022-02-22 ENCOUNTER — Other Ambulatory Visit: Payer: Self-pay | Admitting: Family

## 2022-02-22 VITALS — BP 157/82 | HR 98 | Temp 98.6°F | Resp 17 | Wt 129.1 lb

## 2022-02-22 DIAGNOSIS — D61818 Other pancytopenia: Secondary | ICD-10-CM

## 2022-02-22 DIAGNOSIS — D696 Thrombocytopenia, unspecified: Secondary | ICD-10-CM | POA: Diagnosis not present

## 2022-02-22 LAB — CMP (CANCER CENTER ONLY)
ALT: 10 U/L (ref 0–44)
AST: 18 U/L (ref 15–41)
Albumin: 3.9 g/dL (ref 3.5–5.0)
Alkaline Phosphatase: 75 U/L (ref 38–126)
Anion gap: 9 (ref 5–15)
BUN: 10 mg/dL (ref 8–23)
CO2: 29 mmol/L (ref 22–32)
Calcium: 9.8 mg/dL (ref 8.9–10.3)
Chloride: 102 mmol/L (ref 98–111)
Creatinine: 0.64 mg/dL (ref 0.44–1.00)
GFR, Estimated: >60 mL/min (ref >60)
Glucose, Bld: 99 mg/dL (ref 70–99)
Potassium: 3.8 mmol/L (ref 3.5–5.1)
Sodium: 140 mmol/L (ref 135–145)
Total Bilirubin: 0.7 mg/dL (ref 0.3–1.2)
Total Protein: 7.5 g/dL (ref 6.5–8.1)

## 2022-02-22 LAB — CBC WITH DIFFERENTIAL (CANCER CENTER ONLY)
Abs Immature Granulocytes: 0.02 10*3/uL (ref 0.00–0.07)
Basophils Absolute: 0 10*3/uL (ref 0.0–0.1)
Basophils Relative: 0 %
Eosinophils Absolute: 0 10*3/uL (ref 0.0–0.5)
Eosinophils Relative: 0 %
HCT: 32.7 % — ABNORMAL LOW (ref 36.0–46.0)
Hemoglobin: 10.2 g/dL — ABNORMAL LOW (ref 12.0–15.0)
Immature Granulocytes: 0 %
Lymphocytes Relative: 9 %
Lymphs Abs: 0.8 10*3/uL (ref 0.7–4.0)
MCH: 26.9 pg (ref 26.0–34.0)
MCHC: 31.2 g/dL (ref 30.0–36.0)
MCV: 86.3 fL (ref 80.0–100.0)
Monocytes Absolute: 0.7 10*3/uL (ref 0.1–1.0)
Monocytes Relative: 8 %
Neutro Abs: 6.8 10*3/uL (ref 1.7–7.7)
Neutrophils Relative %: 83 %
Platelet Count: 316 10*3/uL (ref 150–400)
RBC: 3.79 MIL/uL — ABNORMAL LOW (ref 3.87–5.11)
Smear Review: NORMAL
WBC Count: 8.3 10*3/uL (ref 4.0–10.5)
nRBC: 0 % (ref 0.0–0.2)

## 2022-02-22 LAB — SAVE SMEAR(SSMR), FOR PROVIDER SLIDE REVIEW

## 2022-02-22 LAB — LACTATE DEHYDROGENASE: LDH: 202 U/L — ABNORMAL HIGH (ref 98–192)

## 2022-02-22 NOTE — Telephone Encounter (Signed)
Per 02/22/22 los - gave upcoming appointments - confirmed

## 2022-02-22 NOTE — Progress Notes (Signed)
Hematology and Oncology Follow Up Visit  Danielle Adkins [id number] August 19, 1941 81 y.o. 02/22/2022   Principle Diagnosis:  Pancytopenia  Thrombocytopenia   Current Therapy:   Observation Transfusional support as needed  Nplate as indicated    Interim History:  Danielle Adkins is here today with her son for follow-up. She is doing quite well and states that her energy seems better.  She was hospitalized in January with Covid, blood in her stool and pancytopenia.  Endoscopy showed esophagitis, a small hiatal hernia, gastritis and a single angioectasia without bleeding in the duodenum. She received blood and platelets during admission. Her counts remain stable at this time.  Platelets are 316, Hgb 10.2, MCV 86 and WBC count 8.3.  No fever, chills, n/v, cough, rash, dizziness, SOB, chest pain, palpitations, abdominal pain or changes in bowel or bladder habits.  No blood loss noted. No abnormal bruising or petechiae.  No swelling, tenderness, numbness or tingling in her extremities.  No falls or syncope to report.  She has maintained a good appetite and is staying well hydrated throughout the day. Her weight is 129 lbs.   ECOG Performance Status: 1 - Symptomatic but completely ambulatory  Medications:  Allergies as of 02/22/2022   No Known Allergies      Medication List        Accurate as of February 22, 2022  3:44 PM. If you have any questions, ask your nurse or doctor.          COUGH/SORE THROAT LOZENGES MT Use as directed 1 lozenge in the mouth or throat as needed (for a sore throat/irritation).   feeding supplement Liqd Take 237 mLs by mouth 2 (two) times daily between meals.   ferrous sulfate 325 (65 FE) MG EC tablet Take 1 tablet (325 mg total) by mouth daily with breakfast.   folic acid 1 MG tablet Commonly known as: FOLVITE Take 2 tablets (2 mg total) by mouth daily.   multivitamin with minerals Tabs tablet Take 1 tablet by mouth daily.   pantoprazole 40 MG  tablet Commonly known as: PROTONIX Take 1 tablet (40 mg total) by mouth 2 (two) times daily before a meal.        Allergies: No Known Allergies  Past Medical History, Surgical history, Social history, and Family History were reviewed and updated.  Review of Systems: All other 10 point review of systems is negative.   Physical Exam:  weight is 129 lb 1.9 oz (58.6 kg). Her oral temperature is 98.6 F (37 C). Her blood pressure is 157/82 (abnormal) and her pulse is 98. Her respiration is 17 and oxygen saturation is 100%.   Wt Readings from Last 3 Encounters:  02/22/22 129 lb 1.9 oz (58.6 kg)  01/24/22 130 lb 1.1 oz (59 kg)  02/15/20 130 lb (59 kg)    Ocular: Sclerae unicteric, pupils equal, round and reactive to light Ear-nose-throat: Oropharynx clear, dentition fair Lymphatic: No cervical or supraclavicular adenopathy Lungs no rales or rhonchi, good excursion bilaterally Heart regular rate and rhythm, no murmur appreciated Abd soft, nontender, positive bowel sounds MSK no focal spinal tenderness, no joint edema Neuro: non-focal, well-oriented, appropriate affect Breasts: Deferred   Lab Results  Component Value Date   WBC 8.3 02/22/2022   HGB 10.2 (L) 02/22/2022   HCT 32.7 (L) 02/22/2022   MCV 86.3 02/22/2022   PLT 316 02/22/2022   Lab Results  Component Value Date   FERRITIN 9 (L) 01/23/2022   IRON 126 01/23/2022   TIBC  435 01/23/2022   UIBC 309 01/23/2022   IRONPCTSAT 29 01/23/2022   Lab Results  Component Value Date   RETICCTPCT 0.8 01/23/2022   RBC 3.79 (L) 02/22/2022   No results found for: KPAFRELGTCHN, LAMBDASER, KAPLAMBRATIO No results found for: IGGSERUM, IGA, IGMSERUM No results found for: Odetta Pink, SPEI   Chemistry      Component Value Date/Time   NA 140 02/22/2022 1449   K 3.8 02/22/2022 1449   CL 102 02/22/2022 1449   CO2 29 02/22/2022 1449   BUN 10 02/22/2022 1449   CREATININE 0.64  02/22/2022 1449      Component Value Date/Time   CALCIUM 9.8 02/22/2022 1449   ALKPHOS 75 02/22/2022 1449   AST 18 02/22/2022 1449   ALT 10 02/22/2022 1449   BILITOT 0.7 02/22/2022 1449       Impression and Plan: Danielle Adkins is a very pleasant 81 yo African American female with pancytopenia and 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  He counts remain stable and she is doing well at this time.  No questions or concerns noted.  Follow-up in 3 months  Lottie Dawson, NP 2/24/20233:44 PM

## 2022-03-21 DIAGNOSIS — D500 Iron deficiency anemia secondary to blood loss (chronic): Secondary | ICD-10-CM | POA: Diagnosis not present

## 2022-03-21 DIAGNOSIS — D61818 Other pancytopenia: Secondary | ICD-10-CM | POA: Diagnosis not present

## 2022-03-21 DIAGNOSIS — R5383 Other fatigue: Secondary | ICD-10-CM | POA: Diagnosis not present

## 2022-03-28 DIAGNOSIS — H53031 Strabismic amblyopia, right eye: Secondary | ICD-10-CM | POA: Diagnosis not present

## 2022-03-28 DIAGNOSIS — R5383 Other fatigue: Secondary | ICD-10-CM | POA: Diagnosis not present

## 2022-03-28 DIAGNOSIS — D61818 Other pancytopenia: Secondary | ICD-10-CM | POA: Diagnosis not present

## 2022-05-22 ENCOUNTER — Telehealth: Payer: Self-pay | Admitting: *Deleted

## 2022-05-22 ENCOUNTER — Encounter: Payer: Self-pay | Admitting: Hematology & Oncology

## 2022-05-22 ENCOUNTER — Inpatient Hospital Stay (HOSPITAL_BASED_OUTPATIENT_CLINIC_OR_DEPARTMENT_OTHER): Payer: Medicare HMO | Admitting: Hematology & Oncology

## 2022-05-22 ENCOUNTER — Inpatient Hospital Stay: Payer: Medicare HMO | Attending: Hematology & Oncology

## 2022-05-22 VITALS — BP 162/76 | HR 87 | Temp 98.4°F | Resp 20 | Wt 135.1 lb

## 2022-05-22 DIAGNOSIS — D509 Iron deficiency anemia, unspecified: Secondary | ICD-10-CM | POA: Diagnosis not present

## 2022-05-22 DIAGNOSIS — D61818 Other pancytopenia: Secondary | ICD-10-CM | POA: Insufficient documentation

## 2022-05-22 DIAGNOSIS — D696 Thrombocytopenia, unspecified: Secondary | ICD-10-CM | POA: Diagnosis not present

## 2022-05-22 DIAGNOSIS — D62 Acute posthemorrhagic anemia: Secondary | ICD-10-CM

## 2022-05-22 DIAGNOSIS — E876 Hypokalemia: Secondary | ICD-10-CM | POA: Diagnosis not present

## 2022-05-22 LAB — SAVE SMEAR(SSMR), FOR PROVIDER SLIDE REVIEW

## 2022-05-22 LAB — CBC WITH DIFFERENTIAL (CANCER CENTER ONLY)
Abs Immature Granulocytes: 0.01 10*3/uL (ref 0.00–0.07)
Basophils Absolute: 0 10*3/uL (ref 0.0–0.1)
Basophils Relative: 1 %
Eosinophils Absolute: 0.1 10*3/uL (ref 0.0–0.5)
Eosinophils Relative: 1 %
HCT: 39.4 % (ref 36.0–46.0)
Hemoglobin: 12.8 g/dL (ref 12.0–15.0)
Immature Granulocytes: 0 %
Lymphocytes Relative: 20 %
Lymphs Abs: 1 10*3/uL (ref 0.7–4.0)
MCH: 29.2 pg (ref 26.0–34.0)
MCHC: 32.5 g/dL (ref 30.0–36.0)
MCV: 90 fL (ref 80.0–100.0)
Monocytes Absolute: 0.5 10*3/uL (ref 0.1–1.0)
Monocytes Relative: 9 %
Neutro Abs: 3.5 10*3/uL (ref 1.7–7.7)
Neutrophils Relative %: 69 %
Platelet Count: 208 10*3/uL (ref 150–400)
RBC: 4.38 MIL/uL (ref 3.87–5.11)
RDW: 15.2 % (ref 11.5–15.5)
WBC Count: 5.1 10*3/uL (ref 4.0–10.5)
nRBC: 0 % (ref 0.0–0.2)

## 2022-05-22 LAB — CMP (CANCER CENTER ONLY)
ALT: 14 U/L (ref 0–44)
AST: 20 U/L (ref 15–41)
Albumin: 4.7 g/dL (ref 3.5–5.0)
Alkaline Phosphatase: 67 U/L (ref 38–126)
Anion gap: 6 (ref 5–15)
BUN: 28 mg/dL — ABNORMAL HIGH (ref 8–23)
CO2: 33 mmol/L — ABNORMAL HIGH (ref 22–32)
Calcium: 11.1 mg/dL — ABNORMAL HIGH (ref 8.9–10.3)
Chloride: 98 mmol/L (ref 98–111)
Creatinine: 0.64 mg/dL (ref 0.44–1.00)
GFR, Estimated: >60 mL/min (ref >60)
Glucose, Bld: 113 mg/dL — ABNORMAL HIGH (ref 70–99)
Potassium: 5.8 mmol/L — ABNORMAL HIGH (ref 3.5–5.1)
Sodium: 137 mmol/L (ref 135–145)
Total Bilirubin: 0.4 mg/dL (ref 0.3–1.2)
Total Protein: 8.3 g/dL — ABNORMAL HIGH (ref 6.5–8.1)

## 2022-05-22 LAB — SAMPLE TO BLOOD BANK

## 2022-05-22 LAB — LACTATE DEHYDROGENASE: LDH: 199 U/L — ABNORMAL HIGH (ref 98–192)

## 2022-05-22 NOTE — Telephone Encounter (Signed)
Per 05/22/22 los - gave upcoming appointments - confirmed

## 2022-05-22 NOTE — Progress Notes (Signed)
Hematology and Oncology Follow Up Visit  Danielle Adkins [id number] 1941/02/11 81 y.o. 05/22/2022   Principle Diagnosis:  Pancytopenia  Thrombocytopenia  Iron deficiency anemia  Current Therapy:   Observation Transfusional support as needed  IV iron-Feraheme given on 01/24/2022 Nplate as indicated    Interim History:  Danielle Adkins is here today with her son for follow-up.  We last saw her back in January.  Since then, she seems to be doing pretty well.  I do not think she has been admitted to the hospital.  Surprisingly, her blood counts are back to normal.  It is hard to say why they are back to normal or how they got there.  Back in January, her ferritin was only 9.  Her iron saturation was 29%.  She did get a dose of IV iron.  She is eating okay.  She is snacking a [name] bit too much according to her son.  She has had no obvious change in bowel or bladder habits.  She has had no nausea or vomiting.  She has had no rashes.  There has been no fever.  She has had no cough or shortness of breath.  Currently, I would have to say that her performance status is probably ECOG 2.    Medications:  Allergies as of 05/22/2022   No Known Allergies      Medication List        Accurate as of May 22, 2022  1:41 PM. If you have any questions, ask your nurse or doctor.          STOP taking these medications    COUGH/SORE THROAT LOZENGES MT Stopped by: Volanda Napoleon, MD   ferrous sulfate 325 (65 FE) MG EC tablet Stopped by: Volanda Napoleon, MD   folic acid 1 MG tablet Commonly known as: FOLVITE Stopped by: Volanda Napoleon, MD       TAKE these medications    feeding supplement Liqd Take 237 mLs by mouth 2 (two) times daily between meals.   multivitamin with minerals Tabs tablet Take 1 tablet by mouth daily.   pantoprazole 40 MG tablet Commonly known as: PROTONIX Take 1 tablet (40 mg total) by mouth 2 (two) times daily before a meal.        Allergies: No Known  Allergies  Past Medical History, Surgical history, Social history, and Family History were reviewed and updated.  Review of Systems: All other 10 point review of systems is negative.   Physical Exam:  weight is 135 lb 1.3 oz (61.3 kg). Her oral temperature is 98.4 F (36.9 C). Her blood pressure is 162/76 (abnormal) and her pulse is 87. Her respiration is 20 and oxygen saturation is 99%.   Wt Readings from Last 3 Encounters:  05/22/22 135 lb 1.3 oz (61.3 kg)  02/22/22 129 lb 1.9 oz (58.6 kg)  01/24/22 130 lb 1.1 oz (59 kg)    Ocular: Sclerae unicteric, pupils equal, round and reactive to light Ear-nose-throat: Oropharynx clear, dentition fair Lymphatic: No cervical or supraclavicular adenopathy Lungs no rales or rhonchi, good excursion bilaterally Heart regular rate and rhythm, no murmur appreciated Abd soft, nontender, positive bowel sounds MSK no focal spinal tenderness, no joint edema Neuro: non-focal, well-oriented, appropriate affect Breasts: Deferred   Lab Results  Component Value Date   WBC 5.1 05/22/2022   HGB 12.8 05/22/2022   HCT 39.4 05/22/2022   MCV 90.0 05/22/2022   PLT 208 05/22/2022   Lab Results  Component Value  Date   FERRITIN 9 (L) 01/23/2022   IRON 126 01/23/2022   TIBC 435 01/23/2022   UIBC 309 01/23/2022   IRONPCTSAT 29 01/23/2022   Lab Results  Component Value Date   RETICCTPCT 0.8 01/23/2022   RBC 4.38 05/22/2022   No results found for: KPAFRELGTCHN, LAMBDASER, KAPLAMBRATIO No results found for: IGGSERUM, IGA, IGMSERUM No results found for: Odetta Pink, SPEI   Chemistry      Component Value Date/Time   NA 137 05/22/2022 1148   K 5.8 (H) 05/22/2022 1148   CL 98 05/22/2022 1148   CO2 33 (H) 05/22/2022 1148   BUN 28 (H) 05/22/2022 1148   CREATININE 0.64 05/22/2022 1148      Component Value Date/Time   CALCIUM 11.1 (H) 05/22/2022 1148   ALKPHOS 67 05/22/2022 1148   AST 20  05/22/2022 1148   ALT 14 05/22/2022 1148   BILITOT 0.4 05/22/2022 1148       Impression and Plan: Ms. Danielle Adkins is a very pleasant 81 yo African American female with pancytopenia.  This is temporary.  Again her blood counts look normal today.  Everything really has come back to where it needs to be.  A [place] bit worried about the potassium.  This is on the high side.  She is not taking any potassium supplements.  I would like to have her come back in 2 days for lab work only so we can follow-up with her potassium level.  Her blood pressure is also on the higher side.  We will have to let the family doctor know all about this.  Hopefully, we can get her back after Labor Day.  Volanda Napoleon, MD 5/24/20231:41 PM

## 2022-05-24 ENCOUNTER — Telehealth: Payer: Self-pay | Admitting: *Deleted

## 2022-05-24 ENCOUNTER — Inpatient Hospital Stay: Payer: Medicare HMO

## 2022-05-24 DIAGNOSIS — D509 Iron deficiency anemia, unspecified: Secondary | ICD-10-CM | POA: Diagnosis not present

## 2022-05-24 DIAGNOSIS — D696 Thrombocytopenia, unspecified: Secondary | ICD-10-CM | POA: Diagnosis not present

## 2022-05-24 DIAGNOSIS — E876 Hypokalemia: Secondary | ICD-10-CM

## 2022-05-24 DIAGNOSIS — D61818 Other pancytopenia: Secondary | ICD-10-CM | POA: Diagnosis not present

## 2022-05-24 LAB — BASIC METABOLIC PANEL - CANCER CENTER ONLY
Anion gap: 8 (ref 5–15)
BUN: 29 mg/dL — ABNORMAL HIGH (ref 8–23)
CO2: 29 mmol/L (ref 22–32)
Calcium: 10.8 mg/dL — ABNORMAL HIGH (ref 8.9–10.3)
Chloride: 97 mmol/L — ABNORMAL LOW (ref 98–111)
Creatinine: 0.65 mg/dL (ref 0.44–1.00)
GFR, Estimated: >60 mL/min (ref >60)
Glucose, Bld: 113 mg/dL — ABNORMAL HIGH (ref 70–99)
Potassium: 4.8 mmol/L (ref 3.5–5.1)
Sodium: 134 mmol/L — ABNORMAL LOW (ref 135–145)

## 2022-05-24 NOTE — Telephone Encounter (Signed)
-----   Message from Volanda Napoleon, MD sent at 05/24/2022  2:02 PM EDT ----- Please call and let her know that the potassium is back to normal.  Thanks.  Laurey Arrow

## 2022-05-24 NOTE — Telephone Encounter (Signed)
As noted below by Dr. Marin Olp, I left a message for Danielle Adkins, the son, that her potassium level was back to normal at 4.8. instructed him to call the office with any questions or concerns.

## 2022-06-26 DIAGNOSIS — H524 Presbyopia: Secondary | ICD-10-CM | POA: Diagnosis not present

## 2022-08-08 DIAGNOSIS — D500 Iron deficiency anemia secondary to blood loss (chronic): Secondary | ICD-10-CM | POA: Diagnosis not present

## 2022-08-08 DIAGNOSIS — R5383 Other fatigue: Secondary | ICD-10-CM | POA: Diagnosis not present

## 2022-08-15 DIAGNOSIS — Z0000 Encounter for general adult medical examination without abnormal findings: Secondary | ICD-10-CM | POA: Diagnosis not present

## 2022-08-15 DIAGNOSIS — H53031 Strabismic amblyopia, right eye: Secondary | ICD-10-CM | POA: Diagnosis not present

## 2022-08-15 DIAGNOSIS — Z23 Encounter for immunization: Secondary | ICD-10-CM | POA: Diagnosis not present

## 2022-08-15 DIAGNOSIS — D61818 Other pancytopenia: Secondary | ICD-10-CM | POA: Diagnosis not present

## 2022-08-15 DIAGNOSIS — R5383 Other fatigue: Secondary | ICD-10-CM | POA: Diagnosis not present

## 2022-09-18 ENCOUNTER — Inpatient Hospital Stay: Payer: Medicare HMO | Attending: Hematology & Oncology

## 2022-09-18 ENCOUNTER — Inpatient Hospital Stay: Payer: Medicare HMO | Admitting: Family

## 2023-02-20 IMAGING — US US ABDOMEN COMPLETE
1 series · 15 of 25 positions shown · non-contrast
Comparison: None.

CLINICAL DATA: Thrombocytopenia

EXAM:
ABDOMEN ULTRASOUND COMPLETE

[Series 1: us abdomen complete mc & wl · 15 of 91 slices shown]
[im 1/91]
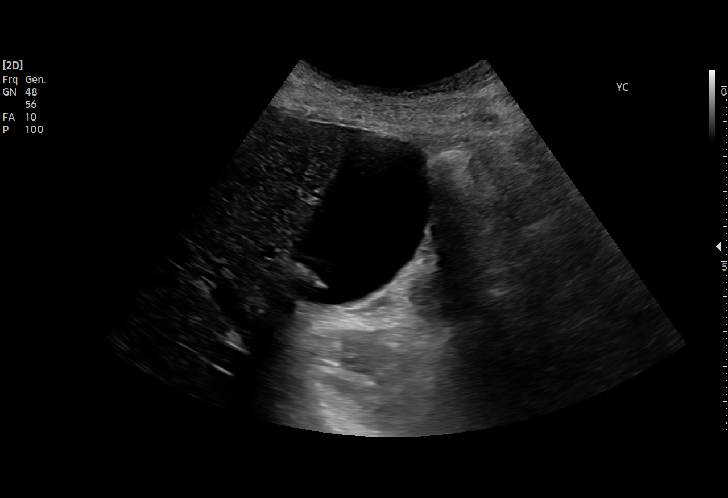
[im 8/91]
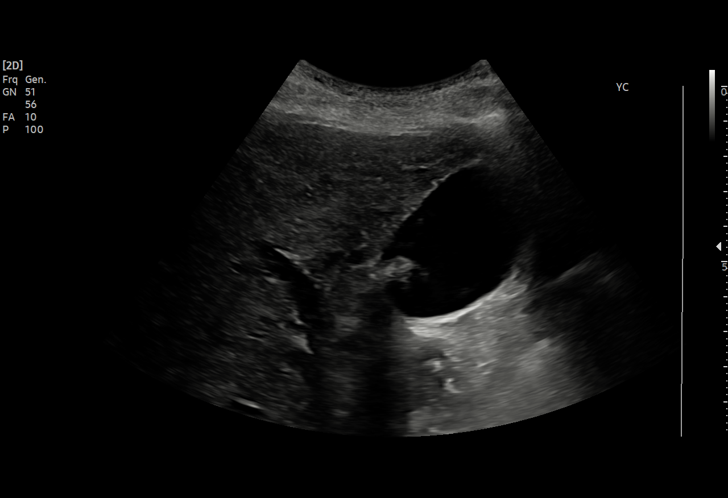
[im 16/91]
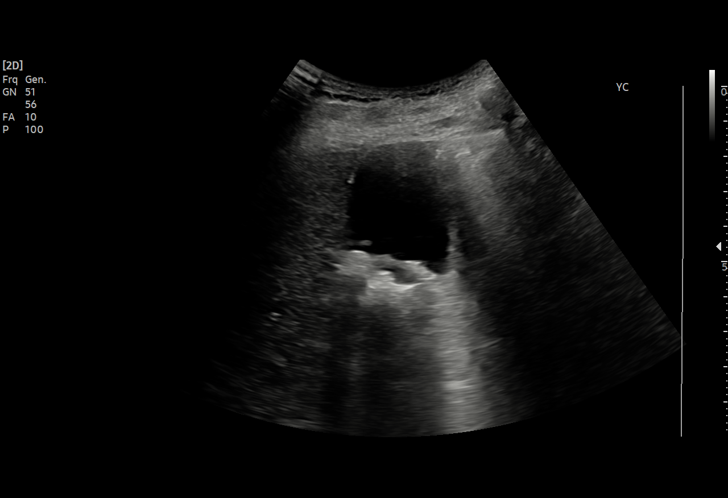
[im 19/91]
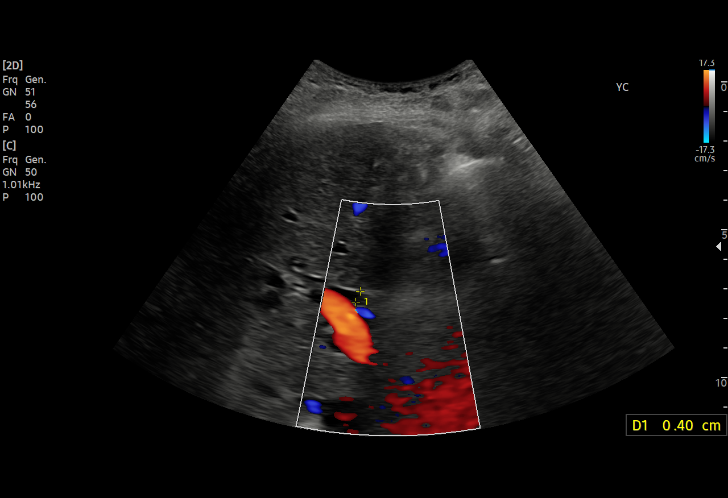
[im 27/91]
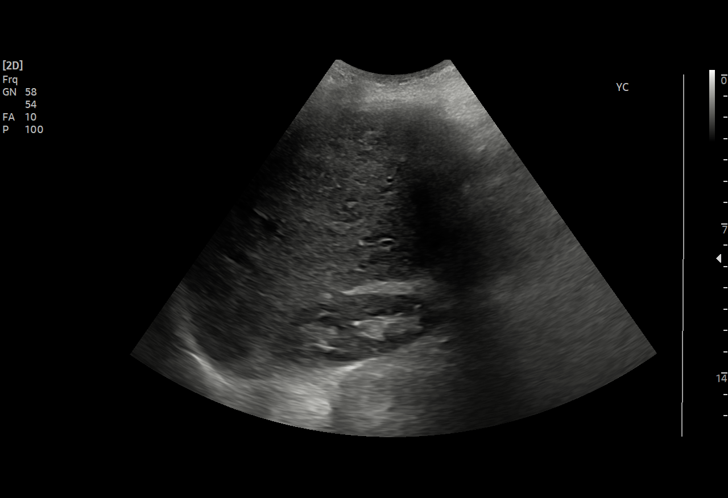
[im 34/91]
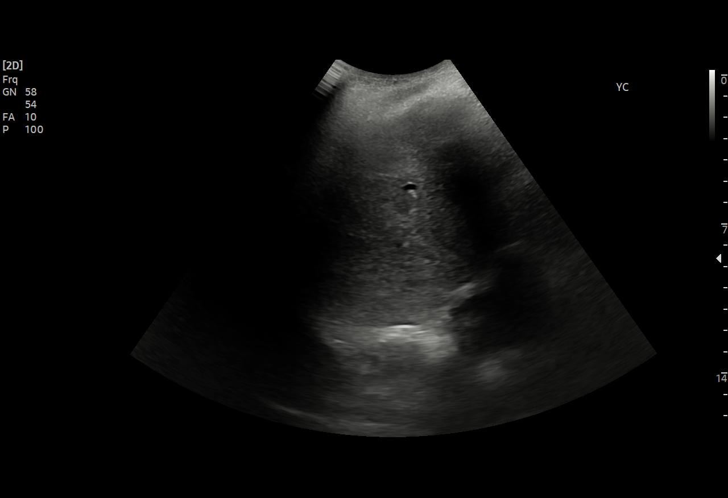
[im 38/91]
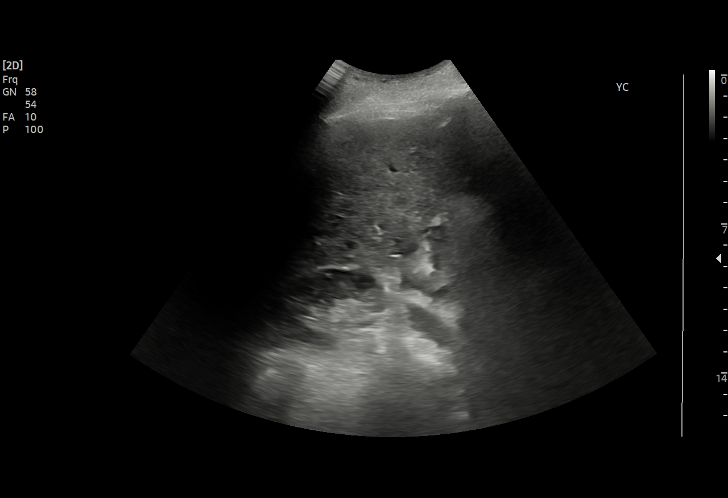
[im 46/91]
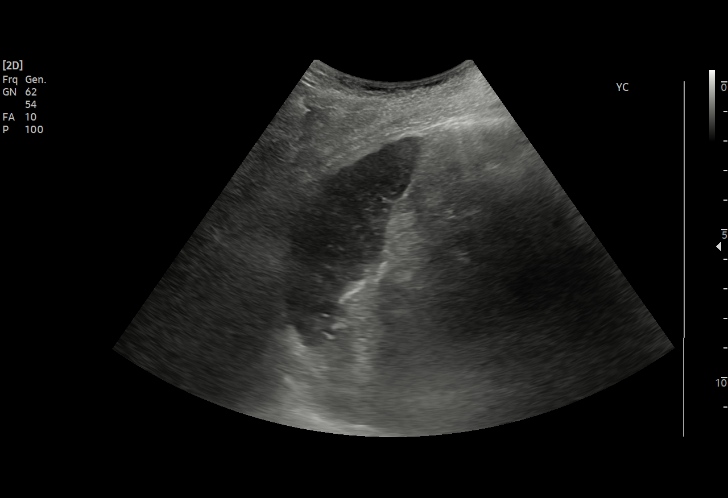
[im 53/91]
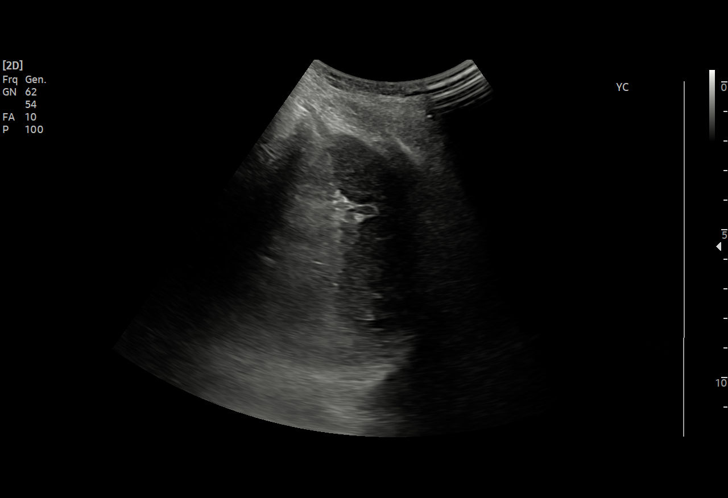
[im 57/91]
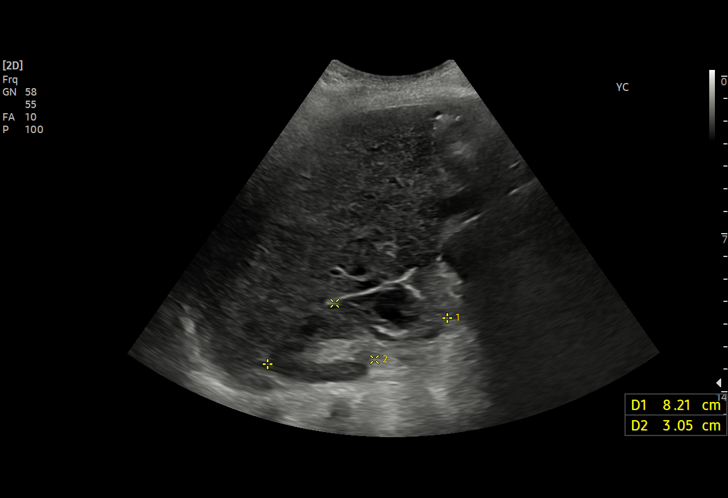
[im 64/91]
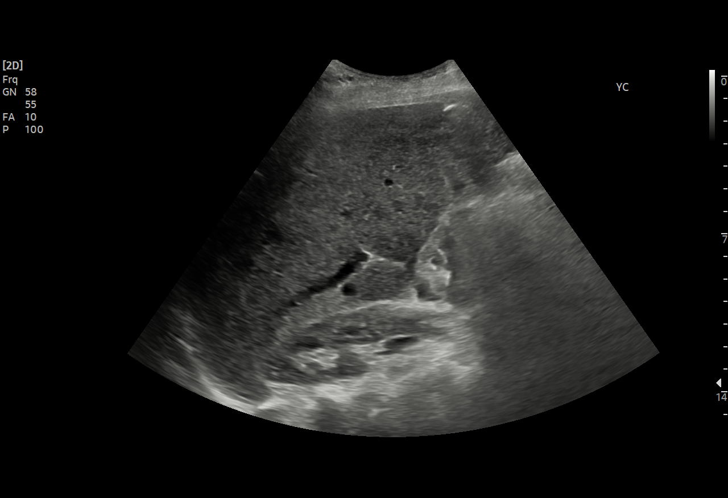
[im 72/91]
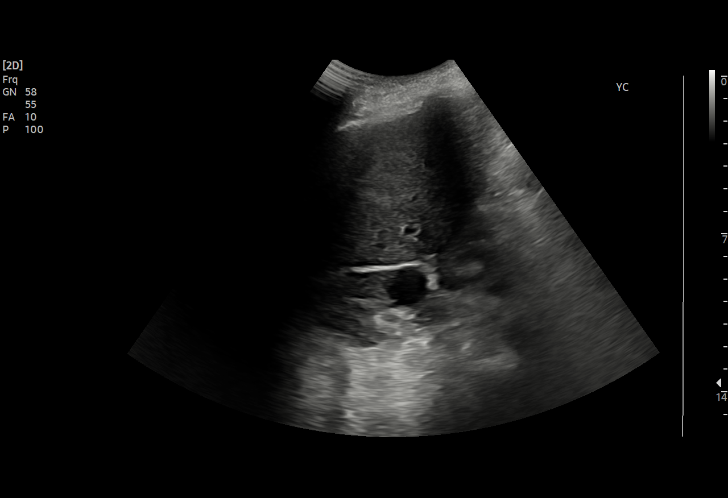
[im 76/91]
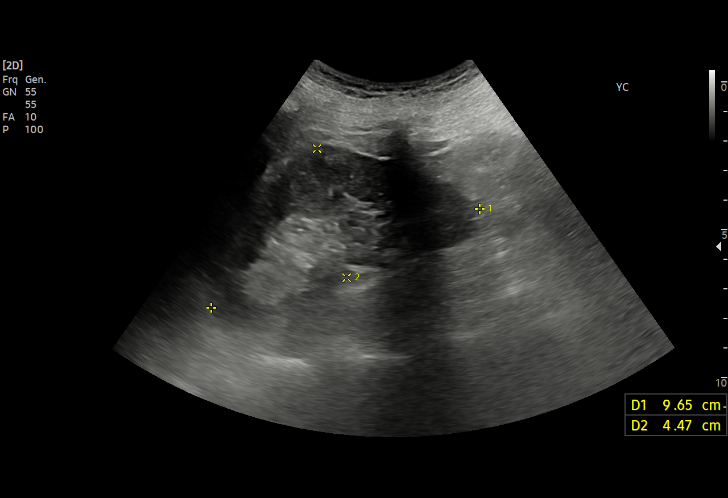
[im 83/91]
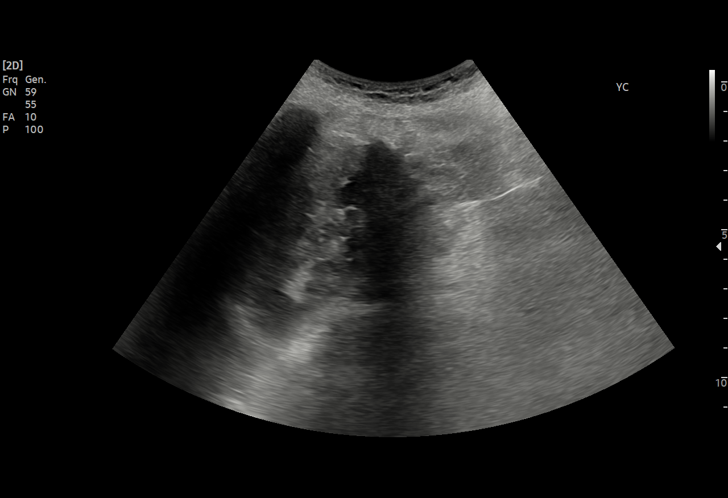
[im 91/91]
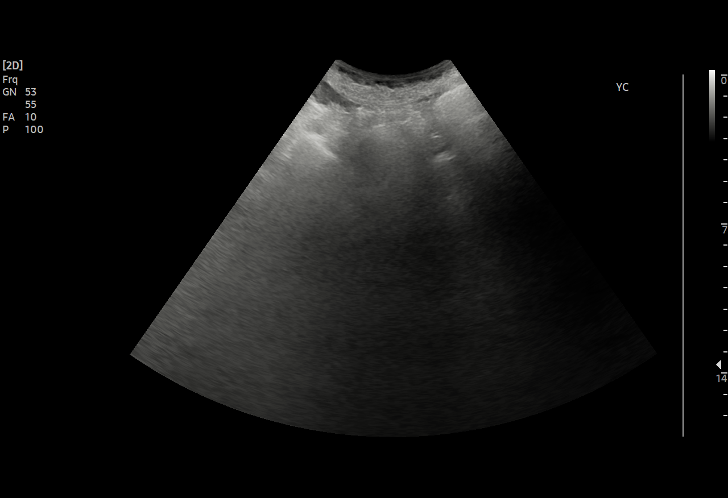

[15 of 25 positions shown; findings below may reference images not displayed]

FINDINGS: Gallbladder: Small layering stones measuring up to 1.2 cm. No wall
thickening or sonographic Murphy sign.

Common bile duct: Diameter: Normal caliber, 4 mm

Liver: Coarsened, heterogeneous echotexture suggesting fatty
infiltration. No focal abnormality. Portal vein is patent on color
Doppler imaging with normal direction of blood flow towards the
liver.

IVC: No abnormality visualized.

Pancreas: Not well visualized due to overlying bowel gas.

Spleen: Size and appearance within normal limits.

Right Kidney: Length: 8.2 cm. 1.8 cm complex cyst in the mid to
lower pole. Thin internal septations noted. No hydronephrosis.
Normal echotexture.

Left Kidney: Length: 9.7 cm. Echogenicity within normal limits. No
mass or hydronephrosis visualized.

Abdominal aorta: No aneurysm visualized.

Other findings: None.
IMPRESSION: Cholelithiasis.  No sonographic evidence of acute cholecystitis.

Coarsened, increased echotexture throughout the liver suggesting
fatty infiltration.

1.8 cm minimally complex cyst in the lower pole of the right kidney.

## 2023-03-13 DIAGNOSIS — D61818 Other pancytopenia: Secondary | ICD-10-CM | POA: Diagnosis not present

## 2023-03-13 DIAGNOSIS — R5383 Other fatigue: Secondary | ICD-10-CM | POA: Diagnosis not present

## 2023-03-13 DIAGNOSIS — H53031 Strabismic amblyopia, right eye: Secondary | ICD-10-CM | POA: Diagnosis not present

## 2023-03-20 DIAGNOSIS — H53031 Strabismic amblyopia, right eye: Secondary | ICD-10-CM | POA: Diagnosis not present

## 2023-03-20 DIAGNOSIS — R5383 Other fatigue: Secondary | ICD-10-CM | POA: Diagnosis not present

## 2023-03-20 DIAGNOSIS — Z23 Encounter for immunization: Secondary | ICD-10-CM | POA: Diagnosis not present

## 2023-08-21 DIAGNOSIS — H53031 Strabismic amblyopia, right eye: Secondary | ICD-10-CM | POA: Diagnosis not present

## 2023-08-21 DIAGNOSIS — D500 Iron deficiency anemia secondary to blood loss (chronic): Secondary | ICD-10-CM | POA: Diagnosis not present

## 2023-08-21 DIAGNOSIS — R5383 Other fatigue: Secondary | ICD-10-CM | POA: Diagnosis not present

## 2023-08-28 DIAGNOSIS — Z0000 Encounter for general adult medical examination without abnormal findings: Secondary | ICD-10-CM | POA: Diagnosis not present

## 2023-08-28 DIAGNOSIS — R739 Hyperglycemia, unspecified: Secondary | ICD-10-CM | POA: Diagnosis not present

## 2023-08-28 DIAGNOSIS — H53031 Strabismic amblyopia, right eye: Secondary | ICD-10-CM | POA: Diagnosis not present

## 2023-08-28 DIAGNOSIS — R5383 Other fatigue: Secondary | ICD-10-CM | POA: Diagnosis not present

## 2023-08-28 DIAGNOSIS — N182 Chronic kidney disease, stage 2 (mild): Secondary | ICD-10-CM | POA: Diagnosis not present

## 2023-08-28 DIAGNOSIS — Z23 Encounter for immunization: Secondary | ICD-10-CM | POA: Diagnosis not present

## 2023-10-30 DIAGNOSIS — H4921 Sixth [abducent] nerve palsy, right eye: Secondary | ICD-10-CM | POA: Diagnosis not present

## 2023-10-30 DIAGNOSIS — H25813 Combined forms of age-related cataract, bilateral: Secondary | ICD-10-CM | POA: Diagnosis not present

## 2023-10-30 DIAGNOSIS — H5213 Myopia, bilateral: Secondary | ICD-10-CM | POA: Diagnosis not present

## 2024-04-15 DIAGNOSIS — N182 Chronic kidney disease, stage 2 (mild): Secondary | ICD-10-CM | POA: Diagnosis not present

## 2024-04-15 DIAGNOSIS — R739 Hyperglycemia, unspecified: Secondary | ICD-10-CM | POA: Diagnosis not present

## 2024-04-15 DIAGNOSIS — R5383 Other fatigue: Secondary | ICD-10-CM | POA: Diagnosis not present

## 2024-04-22 DIAGNOSIS — R739 Hyperglycemia, unspecified: Secondary | ICD-10-CM | POA: Diagnosis not present

## 2024-04-22 DIAGNOSIS — H53031 Strabismic amblyopia, right eye: Secondary | ICD-10-CM | POA: Diagnosis not present

## 2024-04-22 DIAGNOSIS — N182 Chronic kidney disease, stage 2 (mild): Secondary | ICD-10-CM | POA: Diagnosis not present

## 2024-04-22 DIAGNOSIS — R5383 Other fatigue: Secondary | ICD-10-CM | POA: Diagnosis not present

## 2024-09-02 DIAGNOSIS — N182 Chronic kidney disease, stage 2 (mild): Secondary | ICD-10-CM | POA: Diagnosis not present

## 2024-09-02 DIAGNOSIS — R5383 Other fatigue: Secondary | ICD-10-CM | POA: Diagnosis not present

## 2024-09-02 DIAGNOSIS — R739 Hyperglycemia, unspecified: Secondary | ICD-10-CM | POA: Diagnosis not present

## 2024-09-02 DIAGNOSIS — H53031 Strabismic amblyopia, right eye: Secondary | ICD-10-CM | POA: Diagnosis not present

## 2024-09-23 DIAGNOSIS — R5383 Other fatigue: Secondary | ICD-10-CM | POA: Diagnosis not present

## 2024-09-23 DIAGNOSIS — E0590 Thyrotoxicosis, unspecified without thyrotoxic crisis or storm: Secondary | ICD-10-CM | POA: Diagnosis not present

## 2024-09-23 DIAGNOSIS — N182 Chronic kidney disease, stage 2 (mild): Secondary | ICD-10-CM | POA: Diagnosis not present

## 2024-09-23 DIAGNOSIS — H53031 Strabismic amblyopia, right eye: Secondary | ICD-10-CM | POA: Diagnosis not present

## 2024-09-23 DIAGNOSIS — R739 Hyperglycemia, unspecified: Secondary | ICD-10-CM | POA: Diagnosis not present

## 2024-09-23 DIAGNOSIS — Z0000 Encounter for general adult medical examination without abnormal findings: Secondary | ICD-10-CM | POA: Diagnosis not present

## 2024-09-23 DIAGNOSIS — Z23 Encounter for immunization: Secondary | ICD-10-CM | POA: Diagnosis not present

## 2024-11-09 DIAGNOSIS — H4921 Sixth [abducent] nerve palsy, right eye: Secondary | ICD-10-CM | POA: Diagnosis not present

## 2024-11-09 DIAGNOSIS — H25813 Combined forms of age-related cataract, bilateral: Secondary | ICD-10-CM | POA: Diagnosis not present
